# Patient Record
Sex: Female | Born: 1950
Health system: Southern US, Community
[De-identification: ages and names within clinical notes are randomized; demographics above are authoritative.]

## PROBLEM LIST (undated history)

## (undated) DIAGNOSIS — E559 Vitamin D deficiency, unspecified: Secondary | ICD-10-CM

## (undated) DIAGNOSIS — R079 Chest pain, unspecified: Secondary | ICD-10-CM

## (undated) DIAGNOSIS — E7800 Pure hypercholesterolemia, unspecified: Secondary | ICD-10-CM

## (undated) DIAGNOSIS — E039 Hypothyroidism, unspecified: Secondary | ICD-10-CM

## (undated) DIAGNOSIS — E079 Disorder of thyroid, unspecified: Secondary | ICD-10-CM

## (undated) DIAGNOSIS — C801 Malignant (primary) neoplasm, unspecified: Secondary | ICD-10-CM

## (undated) DIAGNOSIS — I10 Essential (primary) hypertension: Secondary | ICD-10-CM

## (undated) HISTORY — DX: Chest pain, unspecified: R07.9

## (undated) HISTORY — DX: Hypothyroidism, unspecified: E03.9

## (undated) HISTORY — DX: Malignant (primary) neoplasm, unspecified: C80.1

## (undated) HISTORY — DX: Essential (primary) hypertension: I10

## (undated) HISTORY — DX: Disorder of thyroid, unspecified: E07.9

## (undated) HISTORY — DX: Vitamin D deficiency, unspecified: E55.9

## (undated) HISTORY — DX: Pure hypercholesterolemia, unspecified: E78.00

---

## 1998-05-26 ENCOUNTER — Other Ambulatory Visit: Admission: RE | Admit: 1998-05-26 | Discharge: 1998-05-26 | Payer: Self-pay | Admitting: Obstetrics and Gynecology

## 2000-01-24 ENCOUNTER — Other Ambulatory Visit: Admission: RE | Admit: 2000-01-24 | Discharge: 2000-01-24 | Payer: Self-pay | Admitting: Obstetrics and Gynecology

## 2001-02-03 ENCOUNTER — Other Ambulatory Visit: Admission: RE | Admit: 2001-02-03 | Discharge: 2001-02-03 | Payer: Self-pay | Admitting: *Deleted

## 2002-12-15 ENCOUNTER — Other Ambulatory Visit: Admission: RE | Admit: 2002-12-15 | Discharge: 2002-12-15 | Payer: Self-pay | Admitting: Obstetrics and Gynecology

## 2003-12-16 ENCOUNTER — Other Ambulatory Visit: Admission: RE | Admit: 2003-12-16 | Discharge: 2003-12-16 | Payer: Self-pay | Admitting: Obstetrics and Gynecology

## 2004-04-02 ENCOUNTER — Ambulatory Visit (HOSPITAL_COMMUNITY): Admission: RE | Admit: 2004-04-02 | Discharge: 2004-04-02 | Payer: Self-pay | Admitting: Gastroenterology

## 2004-07-08 DIAGNOSIS — C801 Malignant (primary) neoplasm, unspecified: Secondary | ICD-10-CM

## 2004-07-08 HISTORY — DX: Malignant (primary) neoplasm, unspecified: C80.1

## 2004-12-26 ENCOUNTER — Other Ambulatory Visit: Admission: RE | Admit: 2004-12-26 | Discharge: 2004-12-26 | Payer: Self-pay | Admitting: Obstetrics and Gynecology

## 2005-12-27 ENCOUNTER — Other Ambulatory Visit: Admission: RE | Admit: 2005-12-27 | Discharge: 2005-12-27 | Payer: Self-pay | Admitting: Obstetrics and Gynecology

## 2012-02-28 ENCOUNTER — Ambulatory Visit (INDEPENDENT_AMBULATORY_CARE_PROVIDER_SITE_OTHER): Payer: BC Managed Care – PPO | Admitting: Obstetrics and Gynecology

## 2012-02-28 ENCOUNTER — Encounter: Payer: Self-pay | Admitting: Obstetrics and Gynecology

## 2012-02-28 VITALS — BP 110/78 | HR 70 | Ht 62.0 in | Wt 135.0 lb

## 2012-02-28 DIAGNOSIS — E079 Disorder of thyroid, unspecified: Secondary | ICD-10-CM | POA: Insufficient documentation

## 2012-02-28 DIAGNOSIS — C801 Malignant (primary) neoplasm, unspecified: Secondary | ICD-10-CM | POA: Insufficient documentation

## 2012-02-28 DIAGNOSIS — Z01419 Encounter for gynecological examination (general) (routine) without abnormal findings: Secondary | ICD-10-CM

## 2012-02-28 DIAGNOSIS — Z124 Encounter for screening for malignant neoplasm of cervix: Secondary | ICD-10-CM

## 2012-02-28 NOTE — Patient Instructions (Signed)
Patient Education Materials to be provided at check out (*indicates is located in accordion folder):  *Calcium Recommendations  

## 2012-02-28 NOTE — Progress Notes (Signed)
The patient is not taking hormone replacement therapy The patient  Is occasionally  taking a Calcium supplement. Post-menopausal bleeding:no  Last Pap: was normal July  2012 Last mammogram: was normal November  2012 Last DEXA scan : T= -1.13 May 2011 Last colonoscopy:normal September 2005  Urinary symptoms: none Normal bowel movements: Yes Reports abuse at home: No:   Subjective:    Samantha Anderson is a 61 y.o. female G2P2 who presents for annual exam.  The patient has no complaints today.   The following portions of the patient's history were reviewed and updated as appropriate: allergies, current medications, past family history, past medical history, past social history, past surgical history and problem list.  Review of Systems Pertinent items are noted in HPI. Gastrointestinal:No change in bowel habits, no abdominal pain, no rectal bleeding Genitourinary:negative for dysuria, frequency, hematuria, nocturia and urinary incontinence    Objective:     BP 110/78  Pulse 70  Ht 5\' 2"  (1.575 m)  Wt 135 lb (61.236 kg)  BMI 24.69 kg/m2  Weight:  Wt Readings from Last 1 Encounters:  02/28/12 135 lb (61.236 kg)     BMI: Body mass index is 24.69 kg/(m^2). General Appearance: Alert, appropriate appearance for age. No acute distress HEENT: Grossly normal Neck / Thyroid: Supple, no masses, nodes or enlargement Lungs: clear to auscultation bilaterally Back: No CVA tenderness Breast Exam: No masses or nodes.No dimpling, nipple retraction or discharge. Cardiovascular: Regular rate and rhythm. S1, S2, no murmur Gastrointestinal: Soft, non-tender, no masses or organomegaly Pelvic Exam: Vulva and vagina appear normal. Bimanual exam reveals normal uterus and adnexa.RV Rectovaginal: normal rectal, no masses Lymphatic Exam: Non-palpable nodes in neck, clavicular, axillary, or inguinal regions Skin: no rash or abnormalities Neurologic: Normal gait and speech, no tremor  Psychiatric:  Alert and oriented, appropriate affect.     Assessment:    Normal gyn exam    Plan:   mammogram pap smear return annually or prn Follow-up:  for annual exam Restart Vitamin D

## 2012-03-02 LAB — PAP IG W/ RFLX HPV ASCU

## 2012-05-25 ENCOUNTER — Encounter: Payer: Self-pay | Admitting: Obstetrics and Gynecology

## 2012-05-25 NOTE — Progress Notes (Signed)
Quick Note:  Please send "Dense breast" letter to patient and document in chart when letter is sent. ______ 

## 2014-05-09 ENCOUNTER — Encounter: Payer: Self-pay | Admitting: Obstetrics and Gynecology

## 2019-08-09 DIAGNOSIS — E559 Vitamin D deficiency, unspecified: Secondary | ICD-10-CM | POA: Diagnosis not present

## 2019-08-09 DIAGNOSIS — Z0000 Encounter for general adult medical examination without abnormal findings: Secondary | ICD-10-CM | POA: Diagnosis not present

## 2019-08-09 DIAGNOSIS — E039 Hypothyroidism, unspecified: Secondary | ICD-10-CM | POA: Diagnosis not present

## 2019-08-09 DIAGNOSIS — Z6827 Body mass index (BMI) 27.0-27.9, adult: Secondary | ICD-10-CM | POA: Diagnosis not present

## 2019-08-09 DIAGNOSIS — I10 Essential (primary) hypertension: Secondary | ICD-10-CM | POA: Diagnosis not present

## 2019-08-09 DIAGNOSIS — Z9181 History of falling: Secondary | ICD-10-CM | POA: Diagnosis not present

## 2019-08-09 DIAGNOSIS — R079 Chest pain, unspecified: Secondary | ICD-10-CM | POA: Diagnosis not present

## 2019-08-09 DIAGNOSIS — Z79899 Other long term (current) drug therapy: Secondary | ICD-10-CM | POA: Diagnosis not present

## 2019-08-09 DIAGNOSIS — E7800 Pure hypercholesterolemia, unspecified: Secondary | ICD-10-CM | POA: Diagnosis not present

## 2019-08-18 ENCOUNTER — Encounter: Payer: Self-pay | Admitting: Cardiology

## 2019-08-18 ENCOUNTER — Other Ambulatory Visit: Payer: Self-pay

## 2019-08-18 ENCOUNTER — Ambulatory Visit (INDEPENDENT_AMBULATORY_CARE_PROVIDER_SITE_OTHER): Payer: Medicare PPO | Admitting: Cardiology

## 2019-08-18 VITALS — BP 150/100 | HR 69 | Ht 61.0 in | Wt 149.0 lb

## 2019-08-18 DIAGNOSIS — R072 Precordial pain: Secondary | ICD-10-CM

## 2019-08-18 DIAGNOSIS — Z01812 Encounter for preprocedural laboratory examination: Secondary | ICD-10-CM

## 2019-08-18 DIAGNOSIS — E782 Mixed hyperlipidemia: Secondary | ICD-10-CM | POA: Diagnosis not present

## 2019-08-18 DIAGNOSIS — I10 Essential (primary) hypertension: Secondary | ICD-10-CM | POA: Diagnosis not present

## 2019-08-18 MED ORDER — TELMISARTAN 40 MG PO TABS: 40.0000 mg | ORAL_TABLET | Freq: Every day | ORAL | 1 refills | Status: DC

## 2019-08-18 NOTE — Patient Instructions (Signed)
Medication Instructions:  Your physician has recommended you make the following change in your medication:   START: Telmisartan 40 mg Take 1 tab daily  *If you need a refill on your cardiac medications before your next appointment, please call your pharmacy*  Lab Work: Your physician recommends that you return for lab work in:   3-7 days prior to CT:BMP   If you have labs (blood work) drawn today and your tests are completely normal, you will receive your results only by: Marland Kitchen MyChart Message (if you have MyChart) OR . A paper copy in the mail If you have any lab test that is abnormal or we need to change your treatment, we will call you to review the results.  Testing/Procedures: Your physician has requested that you have an echocardiogram. Echocardiography is a painless test that uses sound waves to create images of your heart. It provides your doctor with information about the size and shape of your heart and how well your heart's chambers and valves are working. This procedure takes approximately one hour. There are no restrictions for this procedure.   Your physician has requested that you have cardiac CT. Cardiac computed tomography (CT) is a painless test that uses an x-ray machine to take clear, detailed pictures of your heart. For further information please visit [facility]ugeFiesta[employer]tn. Please follow instruction sheet as given.  Your cardiac CT will be scheduled at one of the below locations:   [facility] [address] [place], [place] [address] [phone]   If scheduled at [facility], please arrive at the [facility] main entrance of [facility] 30 minutes prior to test start time. Proceed to the [facility] Radiology Department (first floor) to check-in and test prep.    Please follow these instructions carefully (unless otherwise directed):    On the Night Before the Test: . Be sure to Drink plenty of water. . Do not consume any  caffeinated/decaffeinated beverages or chocolate 12 hours prior to your test. . Do not take any antihistamines 12 hours prior to your test. . If you take Metformin do not take 24 hours prior to test. . If the patient has contrast allergy: ? Patient will need a prescription for Prednisone and very clear instructions (as follows): 1. Prednisone 50 mg - take 13 hours prior to test 2. Take another Prednisone 50 mg 7 hours prior to test 3. Take another Prednisone 50 mg 1 hour prior to test 4. Take Benadryl 50 mg 1 hour prior to test . Patient must complete all four doses of above prophylactic medications. . Patient will need a ride after test due to Benadryl.  On the Day of the Test: . Drink plenty of water. Do not drink any water within one hour of the test. . Do not eat any food 4 hours prior to the test. . You may take your regular medications prior to the test.  . Take metoprolol (TOPROL XL) two hours prior to test. . FEMALES- please wear underwire-free bra if available                 -If HR is less than 55 BPM- No Beta Blocker                -IF HR is greater than 55 BPM and patient is less than or equal to 32 yrs old  Take TOPROL XL 25 mg            After the Test: .  Drink plenty of water. . After receiving IV contrast, you may experience a mild flushed feeling. This is normal. . On occasion, you may experience a mild rash up to 24 hours after the test. This is not dangerous. If this occurs, you can take Benadryl 25 mg and increase your fluid intake. . If you experience trouble breathing, this can be serious. If it is severe call 911 IMMEDIATELY. If it is mild, please call our office..   Once we have confirmed authorization from your insurance company, we will call you to set up a date and time for your test.   For non-scheduling related questions, please contact the cardiac imaging nurse navigator should you have any questions/concerns: Marchia Bond, RN Navigator Cardiac  Imaging [facility] [phone] mobile    Follow-Up: At [facility], you and your health needs are our priority.  As part of our continuing mission to provide you with exceptional heart care, we have created designated Provider Care Teams.  These Care Teams include your primary Cardiologist (physician) and Advanced Practice Providers (APPs -  Physician Assistants and Nurse Practitioners) who all work together to provide you with the care you need, when you need it.  Your next appointment:   2 week(s)  The format for your next appointment:   In Person  Provider:   Berniece Salines, DO  Other Instructions

## 2019-08-18 NOTE — Progress Notes (Signed)
Cardiology Office Note:    Date:  08/18/2019   ID:  JULIE NAY, DOB 09/17/1950, MRN [id number]  PCP:  Myer Peer, MD  Cardiologist:  No primary care provider on file.  Electrophysiologist:  None   Referring MD: Mateo Flow, MD   Chief Complaint  Patient presents with  . Chest Pain    History of Present Illness:    AHSLEY Anderson is a 69 y.o. female with a hx of hypertension, hyperlipidemia, family history of coronary artery disease and hyperthyroidism presents to be evaluated for chest pain. The patient tells me that she has been experiencing intermittent chest pressure which is diffuse of the midsternum area and most times radiates to her neck. She tells she experienced her first episode about a year ago.   I independently review the notes from the patient's pcp office. She has had some difficulties with blood pressure control. She has a recent increase to her medication regimen where she was placed on telmisartan-HCTZ 80-12.94m with addition of toprol xl 25 mg daily. After the started this regimen she experienced episodes of hypotension with her systolic bp in the 993J As a result of this her telmisartan-HCTZ was stopped. She has only been taking her toprol xl.   She offers no other complaints at this time.  Past Medical History:  Diagnosis Date  . Benign essential hypertension   . Cancer ([place] 2006   melanoma  . Chest pain   . Elevated LDL cholesterol level   . Hypothyroidism   . Thyroid disease    hyperthyroidism  . Vitamin D deficiency     Past Surgical History:  Procedure Laterality Date  . CESAREAN SECTION      Current Medications: Current Meds  Medication Sig  . Ascorbic Acid (VITAMIN C) 500 MG CAPS Take 500 mg by mouth daily.  .Marland Kitchenaspirin EC 81 MG tablet Take 81 mg by mouth daily.  .Marland Kitchenatorvastatin (LIPITOR) 20 MG tablet 20 mg daily.  . Cholecalciferol 1.25 MG (50000 UT) capsule cholecalciferol (vitamin D3) 1,250 mcg (50,000 unit) capsule  Take one  capsule by mouth once per week for 8 weeks.  .Marland KitchenMAGNESIUM GLYCINATE PO Take 120 mg by mouth daily.  . metoprolol succinate (TOPROL-XL) 25 MG 24 hr tablet 25 mg daily.  .Marland Kitchenomeprazole (PRILOSEC) 20 MG capsule 20 mg daily.  .Marland KitchenSYNTHROID 100 MCG tablet 100 mcg daily.  . vitamin k 100 MCG tablet Take 100 mcg by mouth daily.  . Zinc Sulfate (ZINC 15 PO) Take 15 mg by mouth daily.     Allergies:   Patient has no known allergies.   Social History   Socioeconomic History  . Marital status: Married    Spouse name: Not on file  . Number of children: Not on file  . Years of education: Not on file  . Highest education level: Not on file  Occupational History  . Not on file  Tobacco Use  . Smoking status: Never Smoker  . Smokeless tobacco: Never Used  Substance and Sexual Activity  . Alcohol use: Yes    Comment: occassionally  . Drug use: No  . Sexual activity: Yes    Birth control/protection: Post-menopausal  Other Topics Concern  . Not on file  Social History Narrative  . Not on file   Social Determinants of Health   Financial Resource Strain:   . Difficulty of Paying Living Expenses: Not on file  Food Insecurity:   . Worried About [employer]  Food in the Last Year: Not on file  . Ran Out of Food in the Last Year: Not on file  Transportation Needs:   . Lack of Transportation (Medical): Not on file  . Lack of Transportation (Non-Medical): Not on file  Physical Activity:   . Days of Exercise per Week: Not on file  . Minutes of Exercise per Session: Not on file  Stress:   . Feeling of Stress : Not on file  Social Connections:   . Frequency of Communication with Friends and Family: Not on file  . Frequency of Social Gatherings with Friends and Family: Not on file  . Attends Religious Services: Not on file  . Active Member of Clubs or Organizations: Not on file  . Attends Archivist Meetings: Not on file  . Marital Status: Not on file     Family History: The  patient's family history includes Aneurysm in her father; Cancer in her father, maternal uncle, and paternal uncle; Hypercholesterolemia in her brother and sister; Hypertension in her brother, father, and sister; Osteoporosis in her mother.  ROS:   Review of Systems  Constitution: Negative for decreased appetite, fever and weight gain.  HENT: Negative for congestion, ear discharge, hoarse voice and sore throat.   Eyes: Negative for discharge, redness, vision loss in right eye and visual halos.  Cardiovascular: Negative for chest pain, dyspnea on exertion, leg swelling, orthopnea and palpitations.  Respiratory: Negative for cough, hemoptysis, shortness of breath and snoring.   Endocrine: Negative for heat intolerance and polyphagia.  Hematologic/Lymphatic: Negative for bleeding problem. Does not bruise/bleed easily.  Skin: Negative for flushing, nail changes, rash and suspicious lesions.  Musculoskeletal: Negative for arthritis, joint pain, muscle cramps, myalgias, neck pain and stiffness.  Gastrointestinal: Negative for abdominal pain, bowel incontinence, diarrhea and excessive appetite.  Genitourinary: Negative for decreased libido, genital sores and incomplete emptying.  Neurological: Negative for brief paralysis, focal weakness, headaches and loss of balance.  Psychiatric/Behavioral: Negative for altered mental status, depression and suicidal ideas.  Allergic/Immunologic: Negative for HIV exposure and persistent infections.    EKGs/Labs/Other Studies Reviewed:    The following studies were reviewed today:   EKG:  The ekg ordered today demonstrates sinus rhythm, HR 73 bpm. No other ekg for comparison.  Recent Labs: I was able to independently reviewed the patient's blood work from his PCP which was on 2021  CBC 6.6, hemoglobin 14.6, hematocrit 43, platelet 327 Chemistry: Glucose 92, BUN 14, creatinine 0.7, total bili 0.5, AST 16, ALT 15, alk phos 81, calcium 9.9, sodium 143, potassium  4.2, chloride 106, bicarb 28, total protein 7.1, Albumin 4.5, globulin 2.6,  Recent Lipid Panel Lipid profile done on by his PCP office I was able to independently review triglyceride 81, total cholesterol 266, HDL 67, LDL 183  Physical Exam:    VS:  BP (!) 150/100 (BP Location: Left Arm, Patient Position: Sitting, Cuff Size: Normal)   Pulse 69   Ht 5' 1"  (1.549 m)   Wt 149 lb (67.6 kg)   SpO2 99%   BMI 28.15 kg/m     Wt Readings from Last 3 Encounters:  08/18/19 149 lb (67.6 kg)  08/09/19 148 lb (67.1 kg)  02/28/12 135 lb (61.2 kg)     GEN: Well nourished, well developed in no acute distress HEENT: Normal NECK: No JVD; No carotid bruits LYMPHATICS: No lymphadenopathy CARDIAC: S1S2 noted,RRR, no murmurs, rubs, gallops RESPIRATORY:  Clear to auscultation without rales, wheezing or rhonchi  ABDOMEN: Soft,  non-tender, non-distended, +bowel sounds, no guarding. EXTREMITIES: No edema, No cyanosis, no clubbing MUSCULOSKELETAL:  No deformity  SKIN: Warm and dry NEUROLOGIC:  Alert and oriented x 3, non-focal PSYCHIATRIC:  Normal affect, good insight  ASSESSMENT:    1. Pre-procedure lab exam   2. Precordial pain   3. Essential hypertension   4. Mixed hyperlipidemia    PLAN:    Her chest pain is concerning- given her risk factors. At this time I will like to do an ischemic evaluation on this patient. She does not have any IV contrast dye allergy. I have discussed a coronary CTA with the patient and she is agreeable to proceed with this testing.  Due to her longstanding hypertension, I will like to get a 2D echocardiogram to assess LV/RV function and wall thickness.   She is hypertensive in the office today: BP manually taken by me right: 1580/110 mmgHg  And left 150/100 mmHg. I did discuss with the patient that she will need an addition agent as her blood pressure target is less than 130/80 mmhg. I will start her on telrmisartan 40 mg daily. She will also take her blood pressure  daily and she will notify my office if her blood pressure persistently closer to 191 mmhg systolic or if she develops dizziness. Otherwise she will follow up in two weeks for blood pressure check.  Her most recent lipid panel shows evidence of LDL 183. She has been started on moderate intensity statin with lipitor 32m daily/ She will continue this for now. She will get repeat lab work with her pcp.   The patient is in agreement with the above plan. The patient left the office in stable condition.  The patient will follow up in   Medication Adjustments/Labs and Tests Ordered: Current medicines are reviewed at length with the patient today.  Concerns regarding medicines are outlined above.  Orders Placed This Encounter  Procedures  . CT CORONARY FRACTIONAL FLOW RESERVE DATA PREP  . CT CORONARY FRACTIONAL FLOW RESERVE FLUID ANALYSIS  . CT CORONARY MORPH W/CTA COR W/SCORE W/CA W/CM &/OR WO/CM  . Basic Metabolic Panel (BMET)  . EKG 12-Lead  . ECHOCARDIOGRAM COMPLETE   Meds ordered this encounter  Medications  . telmisartan (MICARDIS) 40 MG tablet    Sig: Take 1 tablet (40 mg total) by mouth daily.    Dispense:  90 tablet    Refill:  1    Patient Instructions  Medication Instructions:  Your physician has recommended you make the following change in your medication:   START: Telmisartan 40 mg Take 1 tab daily  *If you need a refill on your cardiac medications before your next appointment, please call your pharmacy*  Lab Work: Your physician recommends that you return for lab work in:   3-7 days prior to CT:BMP   If you have labs (blood work) drawn today and your tests are completely normal, you will receive your results only by: .Marland KitchenMyChart Message (if you have MyChart) OR . A paper copy in the mail If you have any lab test that is abnormal or we need to change your treatment, we will call you to review the results.  Testing/Procedures: Your physician has requested that you  have an echocardiogram. Echocardiography is a painless test that uses sound waves to create images of your heart. It provides your doctor with information about the size and shape of your heart and how well your heart's chambers and valves are working. This procedure takes approximately  one hour. There are no restrictions for this procedure.   Your physician has requested that you have cardiac CT. Cardiac computed tomography (CT) is a painless test that uses an x-ray machine to take clear, detailed pictures of your heart. For further information please visit HugeFiesta.tn. Please follow instruction sheet as given.  Your cardiac CT will be scheduled at one of the below locations:   [facility] [address] [place], [place] [address] [phone]   If scheduled at [facility], please arrive at the [facility] main entrance of [facility] 30 minutes prior to test start time. Proceed to the [facility] Radiology Department (first floor) to check-in and test prep.    Please follow these instructions carefully (unless otherwise directed):    On the Night Before the Test: . Be sure to Drink plenty of water. . Do not consume any caffeinated/decaffeinated beverages or chocolate 12 hours prior to your test. . Do not take any antihistamines 12 hours prior to your test. . If you take Metformin do not take 24 hours prior to test. . If the patient has contrast allergy: ? Patient will need a prescription for Prednisone and very clear instructions (as follows): 1. Prednisone 50 mg - take 13 hours prior to test 2. Take another Prednisone 50 mg 7 hours prior to test 3. Take another Prednisone 50 mg 1 hour prior to test 4. Take Benadryl 50 mg 1 hour prior to test . Patient must complete all four doses of above prophylactic medications. . Patient will need a ride after test due to Benadryl.  On the Day of the Test: . Drink plenty of water. Do not drink any water  within one hour of the test. . Do not eat any food 4 hours prior to the test. . You may take your regular medications prior to the test.  . Take metoprolol (TOPROL XL) two hours prior to test. . FEMALES- please wear underwire-free bra if available                 -If HR is less than 55 BPM- No Beta Blocker                -IF HR is greater than 55 BPM and patient is less than or equal to 63 yrs old  Take TOPROL XL 25 mg            After the Test: . Drink plenty of water. . After receiving IV contrast, you may experience a mild flushed feeling. This is normal. . On occasion, you may experience a mild rash up to 24 hours after the test. This is not dangerous. If this occurs, you can take Benadryl 25 mg and increase your fluid intake. . If you experience trouble breathing, this can be serious. If it is severe call 911 IMMEDIATELY. If it is mild, please call our office..   Once we have confirmed authorization from your insurance company, we will call you to set up a date and time for your test.   For non-scheduling related questions, please contact the cardiac imaging nurse navigator should you have any questions/concerns: Marchia Bond, RN Navigator Cardiac Imaging [facility] [phone] mobile    Follow-Up: At [facility], you and your health needs are our priority.  As part of our continuing mission to provide you with exceptional heart care, we have created designated Provider Care Teams.  These Care Teams include your primary Cardiologist (  physician) and Advanced Practice Providers (APPs -  Physician Assistants and Nurse Practitioners) who all work together to provide you with the care you need, when you need it.  Your next appointment:   2 week(s)  The format for your next appointment:   In Person  Provider:   Berniece Salines, DO  Other Instructions      Adopting a Healthy Lifestyle.  Know what a healthy weight is for you (roughly BMI <25) and  aim to maintain this   Aim for 7+ servings of fruits and vegetables daily   65-80+ fluid ounces of water or unsweet tea for healthy kidneys   Limit to max 1 drink of alcohol per day; avoid smoking/tobacco   Limit animal fats in diet for cholesterol and heart health - choose grass fed whenever available   Avoid highly processed foods, and foods high in saturated/trans fats   Aim for low stress - take time to unwind and care for your mental health   Aim for 150 min of moderate intensity exercise weekly for heart health, and weights twice weekly for bone health   Aim for 7-9 hours of sleep daily   When it comes to diets, agreement about the perfect plan isnt easy to find, even among the experts. Experts at the [employer] developed an idea known as the Healthy Eating Plate. Just imagine a plate divided into logical, healthy portions.   The emphasis is on diet quality:   Load up on vegetables and fruits - one-half of your plate: Aim for color and variety, and remember that potatoes dont count.   Go for whole grains - one-quarter of your plate: Whole wheat, barley, wheat berries, quinoa, oats, brown rice, and foods made with them. If you want pasta, go with whole wheat pasta.   Protein power - one-quarter of your plate: Fish, chicken, beans, and nuts are all healthy, versatile protein sources. Limit red meat.   The diet, however, does go beyond the plate, offering a few other suggestions.   Use healthy plant oils, such as olive, canola, soy, corn, sunflower and peanut. Check the labels, and avoid partially hydrogenated oil, which have unhealthy trans fats.   If youre thirsty, drink water. Coffee and tea are good in moderation, but skip sugary drinks and limit milk and dairy products to one or two daily servings.   The type of carbohydrate in the diet is more important than the amount. Some sources of carbohydrates, such as vegetables, fruits, whole grains, and  beans-are healthier than others.   Finally, stay active  Signed, Berniece Salines, DO  08/18/2019 8:18 PM    [facility]

## 2019-08-30 ENCOUNTER — Other Ambulatory Visit: Payer: Self-pay

## 2019-08-30 ENCOUNTER — Ambulatory Visit (INDEPENDENT_AMBULATORY_CARE_PROVIDER_SITE_OTHER): Payer: Medicare PPO | Admitting: Cardiology

## 2019-08-30 ENCOUNTER — Encounter: Payer: Self-pay | Admitting: Cardiology

## 2019-08-30 VITALS — BP 144/98 | HR 68 | Ht 61.0 in | Wt 148.0 lb

## 2019-08-30 DIAGNOSIS — R079 Chest pain, unspecified: Secondary | ICD-10-CM | POA: Diagnosis not present

## 2019-08-30 DIAGNOSIS — I10 Essential (primary) hypertension: Secondary | ICD-10-CM

## 2019-08-30 DIAGNOSIS — E782 Mixed hyperlipidemia: Secondary | ICD-10-CM

## 2019-08-30 NOTE — Patient Instructions (Signed)
Medication Instructions:  Your physician recommends that you continue on your current medications as directed. Please refer to the Current Medication list given to you today.  *If you need a refill on your cardiac medications before your next appointment, please call your pharmacy*  Lab Work: None If you have labs (blood work) drawn today and your tests are completely normal, you will receive your results only by: Marland Kitchen MyChart Message (if you have MyChart) OR . A paper copy in the mail If you have any lab test that is abnormal or we need to change your treatment, we will call you to review the results.  Testing/Procedures: None  Follow-Up: At [facility], you and your health needs are our priority.  As part of our continuing mission to provide you with exceptional heart care, we have created designated Provider Care Teams.  These Care Teams include your primary Cardiologist (physician) and Advanced Practice Providers (APPs -  Physician Assistants and Nurse Practitioners) who all work together to provide you with the care you need, when you need it.  Your next appointment:   3 month(s)  The format for your next appointment:   In Person  Provider:   Berniece Salines, DO  Other Instructions

## 2019-08-30 NOTE — Progress Notes (Signed)
Cardiology Office Note:    Date:  08/30/2019   ID:  Samantha Anderson, DOB 02/27/1951, MRN [id number]  PCP:  Mateo Flow, MD  Cardiologist:  Berniece Salines, DO  Electrophysiologist:  None   Referring MD: Myer Peer, MD   Follow-up visit  History of Present Illness:    Samantha Anderson is a 69 y.o. female with a hx of hypertension, hyperlipidemia, hypothyroidism presented initially on August 18, 2019 to be evaluated for chest pain.  At her visit the patient blood pressure was elevated at that time she was only on metoprolol succinate 25 mg daily.  She has been on telmisartan-hydrochlorothiazide which the patient reported that made her hypotensive.  Given her elevated blood pressure I restarted the patient on telmisartan 40 mg daily.  Therefore her regimen was now metoprolol succinate 25 mg daily and telmisartan 40 mg daily.   Today she is here for a follow up visit and offers no complaints.  She did bring her blood pressure readings from home with home which seems to be averaging between 125 to [id number] mmHg systolic.  Chest pain still intermittent.  Past Medical History:  Diagnosis Date  . Benign essential hypertension   . Cancer ([place]) 2006   melanoma  . Chest pain   . Elevated LDL cholesterol level   . Hypothyroidism   . Thyroid disease    hyperthyroidism  . Vitamin D deficiency     Past Surgical History:  Procedure Laterality Date  . CESAREAN SECTION      Current Medications: Current Meds  Medication Sig  . Ascorbic Acid (VITAMIN C) 500 MG CAPS Take 500 mg by mouth daily.  Marland Kitchen aspirin EC 81 MG tablet Take 81 mg by mouth daily.  Marland Kitchen atorvastatin (LIPITOR) 20 MG tablet 20 mg daily.  . Cholecalciferol (VITAMIN D3) 125 MCG (5000 UT) CAPS Take 10,000 Units by mouth daily.  Marland Kitchen MAGNESIUM GLYCINATE PO Take 120 mg by mouth daily.  . metoprolol succinate (TOPROL-XL) 25 MG 24 hr tablet Take 25 mg by mouth daily.   Marland Kitchen SYNTHROID 100 MCG tablet 100 mcg daily.  Marland Kitchen telmisartan (MICARDIS) 40  MG tablet Take 1 tablet (40 mg total) by mouth daily.  . vitamin k 100 MCG tablet Take 100 mcg by mouth daily.  . Zinc Sulfate (ZINC 15 PO) Take 15 mg by mouth daily.     Allergies:   Patient has no known allergies.   Social History   Socioeconomic History  . Marital status: Married    Spouse name: Not on file  . Number of children: Not on file  . Years of education: Not on file  . Highest education level: Not on file  Occupational History  . Not on file  Tobacco Use  . Smoking status: Never Smoker  . Smokeless tobacco: Never Used  Substance and Sexual Activity  . Alcohol use: Yes    Comment: occassionally  . Drug use: No  . Sexual activity: Yes    Birth control/protection: Post-menopausal  Other Topics Concern  . Not on file  Social History Narrative  . Not on file   Social Determinants of Health   Financial Resource Strain:   . Difficulty of Paying Living Expenses: Not on file  Food Insecurity:   . Worried About Charity fundraiser in the Last Year: Not on file  . Ran Out of Food in the Last Year: Not on file  Transportation Needs:   . Lack of Transportation (Medical): Not on  file  . Lack of Transportation (Non-Medical): Not on file  Physical Activity:   . Days of Exercise per Week: Not on file  . Minutes of Exercise per Session: Not on file  Stress:   . Feeling of Stress : Not on file  Social Connections:   . Frequency of Communication with Friends and Family: Not on file  . Frequency of Social Gatherings with Friends and Family: Not on file  . Attends Religious Services: Not on file  . Active Member of Clubs or Organizations: Not on file  . Attends Archivist Meetings: Not on file  . Marital Status: Not on file     Family History: The patient's family history includes Aneurysm in her father; Cancer in her father, maternal uncle, and paternal uncle; Hypercholesterolemia in her brother and sister; Hypertension in her brother, father, and sister;  Osteoporosis in her mother.  ROS:   Review of Systems  Constitution: Negative for decreased appetite, fever and weight gain.  HENT: Negative for congestion, ear discharge, hoarse voice and sore throat.   Eyes: Negative for discharge, redness, vision loss in right eye and visual halos.  Cardiovascular: Negative for chest pain, dyspnea on exertion, leg swelling, orthopnea and palpitations.  Respiratory: Negative for cough, hemoptysis, shortness of breath and snoring.   Endocrine: Negative for heat intolerance and polyphagia.  Hematologic/Lymphatic: Negative for bleeding problem. Does not bruise/bleed easily.  Skin: Negative for flushing, nail changes, rash and suspicious lesions.  Musculoskeletal: Negative for arthritis, joint pain, muscle cramps, myalgias, neck pain and stiffness.  Gastrointestinal: Negative for abdominal pain, bowel incontinence, diarrhea and excessive appetite.  Genitourinary: Negative for decreased libido, genital sores and incomplete emptying.  Neurological: Negative for brief paralysis, focal weakness, headaches and loss of balance.  Psychiatric/Behavioral: Negative for altered mental status, depression and suicidal ideas.  Allergic/Immunologic: Negative for HIV exposure and persistent infections.    EKGs/Labs/Other Studies Reviewed:    The following studies were reviewed today:   EKG: None today  Recent Labs: No results found for requested labs within last 8760 hours.  Recent Lipid Panel No results found for: CHOL, TRIG, HDL, CHOLHDL, VLDL, LDLCALC, LDLDIRECT  Physical Exam:    VS:  BP (!) 144/98   Pulse 68   Ht 5\' 1"  (1.549 m)   Wt 148 lb (67.1 kg)   BMI 27.96 kg/m     Wt Readings from Last 3 Encounters:  08/30/19 148 lb (67.1 kg)  08/18/19 149 lb (67.6 kg)  08/09/19 148 lb (67.1 kg)     GEN: Well nourished, well developed in no acute distress HEENT: Normal NECK: No JVD; No carotid bruits LYMPHATICS: No lymphadenopathy CARDIAC: S1S2 noted,RRR,  no murmurs, rubs, gallops RESPIRATORY:  Clear to auscultation without rales, wheezing or rhonchi  ABDOMEN: Soft, non-tender, non-distended, +bowel sounds, no guarding. EXTREMITIES: No edema, No cyanosis, no clubbing MUSCULOSKELETAL:  No deformity  SKIN: Warm and dry NEUROLOGIC:  Alert and oriented x 3, non-focal PSYCHIATRIC:  Normal affect, good insight  ASSESSMENT:    1. Essential hypertension   2. Chest pain of uncertain etiology   3. Mixed hyperlipidemia    PLAN:     1.  Her blood pressure seems to be improving.  The patient is asymptomatic at lower  systolic blood pressure therefore I will keep her on this regimen for now.  2.  Her echocardiogram and coronary CTA still pending.  Once these testing have been done further recommendations needed will be made.  3.  Continue patient  on atorvastatin 20 mg daily for hyperlipidemia.   The patient is in agreement with the above plan. The patient left the office in stable condition.  The patient will follow up in 3 months or sooner if needed   Medication Adjustments/Labs and Tests Ordered: Current medicines are reviewed at length with the patient today.  Concerns regarding medicines are outlined above.  No orders of the defined types were placed in this encounter.  No orders of the defined types were placed in this encounter.   Patient Instructions  Medication Instructions:  Your physician recommends that you continue on your current medications as directed. Please refer to the Current Medication list given to you today.  *If you need a refill on your cardiac medications before your next appointment, please call your pharmacy*  Lab Work: None If you have labs (blood work) drawn today and your tests are completely normal, you will receive your results only by: Marland Kitchen MyChart Message (if you have MyChart) OR . A paper copy in the mail If you have any lab test that is abnormal or we need to change your treatment, we will call you to  review the results.  Testing/Procedures: None  Follow-Up: At [facility], you and your health needs are our priority.  As part of our continuing mission to provide you with exceptional heart care, we have created designated Provider Care Teams.  These Care Teams include your primary Cardiologist (physician) and Advanced Practice Providers (APPs -  Physician Assistants and Nurse Practitioners) who all work together to provide you with the care you need, when you need it.  Your next appointment:   3 month(s)  The format for your next appointment:   In Person  Provider:   Berniece Salines, DO  Other Instructions      Adopting a Healthy Lifestyle.  Know what a healthy weight is for you (roughly BMI <25) and aim to maintain this   Aim for 7+ servings of fruits and vegetables daily   65-80+ fluid ounces of water or unsweet tea for healthy kidneys   Limit to max 1 drink of alcohol per day; avoid smoking/tobacco   Limit animal fats in diet for cholesterol and heart health - choose grass fed whenever available   Avoid highly processed foods, and foods high in saturated/trans fats   Aim for low stress - take time to unwind and care for your mental health   Aim for 150 min of moderate intensity exercise weekly for heart health, and weights twice weekly for bone health   Aim for 7-9 hours of sleep daily   When it comes to diets, agreement about the perfect plan isnt easy to find, even among the experts. Experts at the [employer] developed an idea known as the Healthy Eating Plate. Just imagine a plate divided into logical, healthy portions.   The emphasis is on diet quality:   Load up on vegetables and fruits - one-half of your plate: Aim for color and variety, and remember that potatoes dont count.   Go for whole grains - one-quarter of your plate: Whole wheat, barley, wheat berries, quinoa, oats, brown rice, and foods made with them. If you want pasta, go  with whole wheat pasta.   Protein power - one-quarter of your plate: Fish, chicken, beans, and nuts are all healthy, versatile protein sources. Limit red meat.   The diet, however, does go beyond the plate, offering a few other suggestions.   Use healthy plant  oils, such as olive, canola, soy, corn, sunflower and peanut. Check the labels, and avoid partially hydrogenated oil, which have unhealthy trans fats.   If youre thirsty, drink water. Coffee and tea are good in moderation, but skip sugary drinks and limit milk and dairy products to one or two daily servings.   The type of carbohydrate in the diet is more important than the amount. Some sources of carbohydrates, such as vegetables, fruits, whole grains, and beans-are healthier than others.   Finally, stay active  Signed, Berniece Salines, DO  08/30/2019 9:53 AM    [place]

## 2019-09-02 DIAGNOSIS — Z1211 Encounter for screening for malignant neoplasm of colon: Secondary | ICD-10-CM | POA: Diagnosis not present

## 2019-09-02 DIAGNOSIS — Z1239 Encounter for other screening for malignant neoplasm of breast: Secondary | ICD-10-CM | POA: Diagnosis not present

## 2019-09-02 DIAGNOSIS — M810 Age-related osteoporosis without current pathological fracture: Secondary | ICD-10-CM | POA: Diagnosis not present

## 2019-09-02 DIAGNOSIS — Z124 Encounter for screening for malignant neoplasm of cervix: Secondary | ICD-10-CM | POA: Diagnosis not present

## 2019-09-02 DIAGNOSIS — Z6826 Body mass index (BMI) 26.0-26.9, adult: Secondary | ICD-10-CM | POA: Diagnosis not present

## 2019-09-02 DIAGNOSIS — Z1231 Encounter for screening mammogram for malignant neoplasm of breast: Secondary | ICD-10-CM | POA: Diagnosis not present

## 2019-09-02 DIAGNOSIS — Z01419 Encounter for gynecological examination (general) (routine) without abnormal findings: Secondary | ICD-10-CM | POA: Diagnosis not present

## 2019-09-02 DIAGNOSIS — Z8481 Family history of carrier of genetic disease: Secondary | ICD-10-CM | POA: Diagnosis not present

## 2019-09-02 DIAGNOSIS — E559 Vitamin D deficiency, unspecified: Secondary | ICD-10-CM | POA: Diagnosis not present

## 2019-09-28 DIAGNOSIS — Z01812 Encounter for preprocedural laboratory examination: Secondary | ICD-10-CM | POA: Diagnosis not present

## 2019-09-28 DIAGNOSIS — R072 Precordial pain: Secondary | ICD-10-CM | POA: Diagnosis not present

## 2019-09-28 LAB — BASIC METABOLIC PANEL
BUN/Creatinine Ratio: 21 (ref 12–28)
BUN: 17 mg/dL (ref 8–27)
CO2: 23 mmol/L (ref 20–29)
Calcium: 9.8 mg/dL (ref 8.7–10.3)
Chloride: 102 mmol/L (ref 96–106)
Creatinine, Ser: 0.8 mg/dL (ref 0.57–1.00)
GFR calc Af Amer: 89 mL/min/{1.73_m2} (ref >59)
GFR calc non Af Amer: 77 mL/min/{1.73_m2} (ref >59)
Glucose: 87 mg/dL (ref 65–99)
Potassium: 4.6 mmol/L (ref 3.5–5.2)
Sodium: 140 mmol/L (ref 134–144)

## 2019-09-29 ENCOUNTER — Telehealth (HOSPITAL_COMMUNITY): Payer: Self-pay | Admitting: Emergency Medicine

## 2019-09-29 ENCOUNTER — Encounter (HOSPITAL_COMMUNITY): Payer: Self-pay

## 2019-09-29 DIAGNOSIS — Z139 Encounter for screening, unspecified: Secondary | ICD-10-CM | POA: Diagnosis not present

## 2019-09-29 DIAGNOSIS — D039 Melanoma in situ, unspecified: Secondary | ICD-10-CM | POA: Insufficient documentation

## 2019-09-29 DIAGNOSIS — Z8052 Family history of malignant neoplasm of bladder: Secondary | ICD-10-CM | POA: Diagnosis not present

## 2019-09-29 DIAGNOSIS — Z803 Family history of malignant neoplasm of breast: Secondary | ICD-10-CM | POA: Diagnosis not present

## 2019-09-29 DIAGNOSIS — Z8481 Family history of carrier of genetic disease: Secondary | ICD-10-CM | POA: Diagnosis not present

## 2019-09-29 DIAGNOSIS — Z800 Family history of malignant neoplasm of digestive organs: Secondary | ICD-10-CM | POA: Diagnosis not present

## 2019-09-29 DIAGNOSIS — M810 Age-related osteoporosis without current pathological fracture: Secondary | ICD-10-CM | POA: Diagnosis not present

## 2019-09-29 DIAGNOSIS — M8580 Other specified disorders of bone density and structure, unspecified site: Secondary | ICD-10-CM | POA: Insufficient documentation

## 2019-09-29 DIAGNOSIS — E7800 Pure hypercholesterolemia, unspecified: Secondary | ICD-10-CM | POA: Insufficient documentation

## 2019-09-29 DIAGNOSIS — Z801 Family history of malignant neoplasm of trachea, bronchus and lung: Secondary | ICD-10-CM | POA: Diagnosis not present

## 2019-09-29 DIAGNOSIS — E559 Vitamin D deficiency, unspecified: Secondary | ICD-10-CM | POA: Insufficient documentation

## 2019-09-29 NOTE — Telephone Encounter (Signed)
Pt calling to clarify instructions from mychart message sent by me earlier today. She takes her daily TOPROL XL nightly. So she was instructed to take per usual, adding dose 2 hr prior to scan for HR control. Pt verbalized understanding.  Marchia Bond RN Navigator Cardiac Imaging [facility] [phone] Office  [phone] Cell

## 2019-09-30 ENCOUNTER — Other Ambulatory Visit: Payer: Self-pay

## 2019-09-30 ENCOUNTER — Ambulatory Visit (HOSPITAL_COMMUNITY)
Admission: RE | Admit: 2019-09-30 | Discharge: 2019-09-30 | Disposition: A | Discharge status: Home / Self Care | Payer: Medicare PPO | Source: Ambulatory Visit | Attending: Cardiology | Admitting: Cardiology

## 2019-09-30 DIAGNOSIS — R072 Precordial pain: Secondary | ICD-10-CM | POA: Insufficient documentation

## 2019-09-30 MED ORDER — METOPROLOL TARTRATE 5 MG/5ML IV SOLN
INTRAVENOUS | Status: AC
  Filled 2019-09-30: qty 5

## 2019-09-30 MED ORDER — NITROGLYCERIN 0.4 MG SL SUBL
0.8000 mg | SUBLINGUAL_TABLET | Freq: Once | SUBLINGUAL | Status: AC
  Administered 2019-09-30: 0.8 mg via SUBLINGUAL

## 2019-09-30 MED ORDER — IOHEXOL 350 MG/ML SOLN
100.0000 mL | Freq: Once | INTRAVENOUS | Status: AC | PRN
  Administered 2019-09-30: 16:00:00 100 mL via INTRAVENOUS

## 2019-09-30 MED ORDER — NITROGLYCERIN 0.4 MG SL SUBL
SUBLINGUAL_TABLET | SUBLINGUAL | Status: AC
  Filled 2019-09-30: qty 2

## 2019-10-01 ENCOUNTER — Ambulatory Visit (INDEPENDENT_AMBULATORY_CARE_PROVIDER_SITE_OTHER): Payer: Medicare PPO

## 2019-10-01 DIAGNOSIS — R072 Precordial pain: Secondary | ICD-10-CM

## 2019-10-01 NOTE — Progress Notes (Signed)
Complete echocardiogram has been performed.  Jimmy Nannette Zill RDCS, RVT 

## 2019-12-03 ENCOUNTER — Other Ambulatory Visit: Payer: Self-pay

## 2019-12-03 DIAGNOSIS — E0590 Thyrotoxicosis, unspecified without thyrotoxic crisis or storm: Secondary | ICD-10-CM | POA: Insufficient documentation

## 2019-12-03 DIAGNOSIS — C439 Malignant melanoma of skin, unspecified: Secondary | ICD-10-CM | POA: Insufficient documentation

## 2019-12-08 ENCOUNTER — Other Ambulatory Visit: Payer: Self-pay

## 2019-12-08 ENCOUNTER — Encounter: Payer: Self-pay | Admitting: Cardiology

## 2019-12-08 ENCOUNTER — Ambulatory Visit: Payer: Medicare PPO | Admitting: Cardiology

## 2019-12-08 VITALS — BP 142/90 | HR 73 | Ht 61.0 in | Wt 148.0 lb

## 2019-12-08 DIAGNOSIS — R5383 Other fatigue: Secondary | ICD-10-CM

## 2019-12-08 DIAGNOSIS — I10 Essential (primary) hypertension: Secondary | ICD-10-CM

## 2019-12-08 DIAGNOSIS — E782 Mixed hyperlipidemia: Secondary | ICD-10-CM

## 2019-12-08 DIAGNOSIS — R400 Somnolence: Secondary | ICD-10-CM | POA: Diagnosis not present

## 2019-12-08 MED ORDER — TELMISARTAN 40 MG PO TABS: ORAL_TABLET | ORAL | 3 refills | Status: DC

## 2019-12-08 NOTE — Progress Notes (Signed)
Cardiology Office Note:    Date:  12/08/2019   ID:  Samantha Anderson, DOB 01/18/1951, MRN [id number]  PCP:  Mateo Flow, MD  Cardiologist:  Berniece Salines, DO  Electrophysiologist:  None   Referring MD: Mateo Flow, MD   " I am   History of Present Illness:    Samantha Anderson is a 69 y.o. female with a hx of hypertension, hyperlipidemia, hypothyroidism presented initially on August 18, 2019 to be evaluated for chest pain.  At her visit the patient blood pressure was elevated at that time she was only on metoprolol succinate 25 mg daily.  She has been on telmisartan-hydrochlorothiazide which the patient reported that made her hypotensive.  Given her elevated blood pressure I restarted the patient on telmisartan 40 mg daily.  Therefore her regimen was now metoprolol succinate 25 mg daily and telmisartan 40 mg daily.  I did see the patient on 08/30/2019 at that time she complained of chest pain therefore I recommended she undergo a CCTA.  In the interim she was able to get this testing done which showed no evidence of CAD.   She is here today for a follow up visit.  She has been fatigue significantly recently and notes daytime somnolence.  She stopped the atorvastatin given the fact that she had coronary calcium score 0.   Past Medical History:  Diagnosis Date  . Benign essential hypertension   . Cancer ([place]) 2006   melanoma  . Chest pain   . Elevated LDL cholesterol level   . Hypothyroidism   . Thyroid disease    hyperthyroidism  . Vitamin D deficiency     Past Surgical History:  Procedure Laterality Date  . CESAREAN SECTION      Current Medications: Current Meds  Medication Sig  . Ascorbic Acid (VITAMIN C) 500 MG CAPS Take 500 mg by mouth daily.  Marland Kitchen aspirin EC 81 MG tablet Take 81 mg by mouth daily.  Marland Kitchen atorvastatin (LIPITOR) 20 MG tablet 20 mg daily.  . Cholecalciferol (VITAMIN D3) 125 MCG (5000 UT) CAPS Take 10,000 Units by mouth daily.  Marland Kitchen MAGNESIUM GLYCINATE PO Take  120 mg by mouth daily.  . metoprolol succinate (TOPROL-XL) 25 MG 24 hr tablet Take 25 mg by mouth daily.   Marland Kitchen SYNTHROID 100 MCG tablet 100 mcg daily.  Marland Kitchen telmisartan-hydrochlorothiazide (MICARDIS HCT) 80-12.5 MG tablet Take 1 tablet by mouth daily.  . traMADol (ULTRAM) 50 MG tablet Take 1 tablet by mouth every 6 (six) hours as needed.  . vitamin k 100 MCG tablet Take 100 mcg by mouth daily.  . Zinc Sulfate (ZINC 15 PO) Take 15 mg by mouth daily.  . [DISCONTINUED] omeprazole (PRILOSEC) 20 MG capsule 20 mg daily.  . [DISCONTINUED] ondansetron (ZOFRAN-ODT) 8 MG disintegrating tablet Take 1 tablet by mouth every 8 (eight) hours.  . [DISCONTINUED] telmisartan (MICARDIS) 40 MG tablet Take 1 tablet (40 mg total) by mouth daily.  . [DISCONTINUED] valsartan-hydrochlorothiazide (DIOVAN-HCT) 160-12.5 MG tablet Take 1 tablet by mouth daily.     Allergies:   Patient has no known allergies.   Social History   Socioeconomic History  . Marital status: Married    Spouse name: Not on file  . Number of children: Not on file  . Years of education: Not on file  . Highest education level: Not on file  Occupational History  . Not on file  Tobacco Use  . Smoking status: Never Smoker  . Smokeless tobacco: Never Used  Substance and Sexual Activity  . Alcohol use: Yes    Comment: occassionally  . Drug use: No  . Sexual activity: Yes    Birth control/protection: Post-menopausal  Other Topics Concern  . Not on file  Social History Narrative  . Not on file   Social Determinants of Health   Financial Resource Strain:   . Difficulty of Paying Living Expenses:   Food Insecurity:   . Worried About Charity fundraiser in the Last Year:   . Arboriculturist in the Last Year:   Transportation Needs:   . Film/video editor (Medical):   Marland Kitchen Lack of Transportation (Non-Medical):   Physical Activity:   . Days of Exercise per Week:   . Minutes of Exercise per Session:   Stress:   . Feeling of Stress :     Social Connections:   . Frequency of Communication with Friends and Family:   . Frequency of Social Gatherings with Friends and Family:   . Attends Religious Services:   . Active Member of Clubs or Organizations:   . Attends Archivist Meetings:   Marland Kitchen Marital Status:      Family History: The patient's family history includes Aneurysm in her father; Cancer in her father, maternal uncle, and paternal uncle; Hypercholesterolemia in her brother and sister; Hypertension in her brother, father, and sister; Osteoporosis in her mother.  ROS:   Review of Systems  Constitution: Negative for decreased appetite, fever and weight gain.  HENT: Negative for congestion, ear discharge, hoarse voice and sore throat.   Eyes: Negative for discharge, redness, vision loss in right eye and visual halos.  Cardiovascular: Negative for chest pain, dyspnea on exertion, leg swelling, orthopnea and palpitations.  Respiratory: Negative for cough, hemoptysis, shortness of breath and snoring.   Endocrine: Negative for heat intolerance and polyphagia.  Hematologic/Lymphatic: Negative for bleeding problem. Does not bruise/bleed easily.  Skin: Negative for flushing, nail changes, rash and suspicious lesions.  Musculoskeletal: Negative for arthritis, joint pain, muscle cramps, myalgias, neck pain and stiffness.  Gastrointestinal: Negative for abdominal pain, bowel incontinence, diarrhea and excessive appetite.  Genitourinary: Negative for decreased libido, genital sores and incomplete emptying.  Neurological: Negative for brief paralysis, focal weakness, headaches and loss of balance.  Psychiatric/Behavioral: Negative for altered mental status, depression and suicidal ideas.  Allergic/Immunologic: Negative for HIV exposure and persistent infections.    EKGs/Labs/Other Studies Reviewed:    The following studies were reviewed today:   EKG: None today  CCTA impression  1. Coronary calcium score of 0. This  was 0 percentile for age and sex matched control.  2. Normal coronary origin with right dominance.  3. No evidence of CAD.  Echo IMPRESSIONS  1. Left ventricular ejection fraction, by estimation, is 60 to 65%. The  left ventricle has normal function. The left ventricle has no regional  wall motion abnormalities. There is mild concentric left ventricular  hypertrophy. Left ventricular diastolic  parameters are consistent with Grade I diastolic dysfunction (impaired  relaxation).  2. Right ventricular systolic function is normal. The right ventricular  size is normal. There is normal pulmonary artery systolic pressure.  3. The mitral valve is normal in structure. Mild mitral valve  regurgitation. No evidence of mitral stenosis.  4. The aortic valve is tricuspid. Aortic valve regurgitation is not  visualized. No aortic stenosis is present.  5. The inferior vena cava is normal in size with greater than 50%  respiratory variability, suggesting right atrial pressure  of 3 mmHg.   Recent Labs: 09/28/2019: BUN 17; Creatinine, Ser 0.80; Potassium 4.6; Sodium 140  Recent Lipid Panel No results found for: CHOL, TRIG, HDL, CHOLHDL, VLDL, LDLCALC, LDLDIRECT  Physical Exam:    VS:  BP (!) 142/90   Pulse 73   Ht 5\' 1"  (1.549 m)   Wt 148 lb (67.1 kg)   SpO2 97%   BMI 27.96 kg/m     Wt Readings from Last 3 Encounters:  12/08/19 148 lb (67.1 kg)  08/30/19 148 lb (67.1 kg)  08/18/19 149 lb (67.6 kg)     GEN: Well nourished, well developed in no acute distress HEENT: Normal NECK: No JVD; No carotid bruits LYMPHATICS: No lymphadenopathy CARDIAC: S1S2 noted,RRR, no murmurs, rubs, gallops RESPIRATORY:  Clear to auscultation without rales, wheezing or rhonchi  ABDOMEN: Soft, non-tender, non-distended, +bowel sounds, no guarding. EXTREMITIES: No edema, No cyanosis, no clubbing MUSCULOSKELETAL:  No deformity  SKIN: Warm and dry NEUROLOGIC:  Alert and oriented x 3,  non-focal PSYCHIATRIC:  Normal affect, good insight  ASSESSMENT:    1. Essential hypertension   2. Mixed hyperlipidemia   3. Fatigue, unspecified type   4. Daytime somnolence    PLAN:     1.  She did bring her home blood pressure monitor report for me which is averaging between 1 45-1 60 at this time I like to increase her telmisartan from 40 mg daily to 40 mg in the morning and 20 mg at nighttime.  She will still continue on her metoprolol succinate 25 mg daily.    2.  Concern that her daytime somnolence may be due to sleep apnea.  Therefore I recommended the patient undergo a sleep study.  She admits to great anxiety of not being able to sleep outside of her house therefore I think she may be a good candidate for home sleep study.  3.  Hyperlipidemia her LDL in February was 183-she has stopped taking her Lipitor.  She wants to do diet modification for now which is not unreasonable we will repeat the lipid profile in 3 months.  At that time if elevated I did tell the patient I would strongly suggest that she restart her statin.  The patient is in agreement with the above plan. The patient left the office in stable condition.  The patient will follow up in 6 months or sooner if needed.   Medication Adjustments/Labs and Tests Ordered: Current medicines are reviewed at length with the patient today.  Concerns regarding medicines are outlined above.  No orders of the defined types were placed in this encounter.  No orders of the defined types were placed in this encounter.   There are no Patient Instructions on file for this visit.   Adopting a Healthy Lifestyle.  Know what a healthy weight is for you (roughly BMI <25) and aim to maintain this   Aim for 7+ servings of fruits and vegetables daily   65-80+ fluid ounces of water or unsweet tea for healthy kidneys   Limit to max 1 drink of alcohol per day; avoid smoking/tobacco   Limit animal fats in diet for cholesterol and heart  health - choose grass fed whenever available   Avoid highly processed foods, and foods high in saturated/trans fats   Aim for low stress - take time to unwind and care for your mental health   Aim for 150 min of moderate intensity exercise weekly for heart health, and weights twice weekly for bone health  Aim for 7-9 hours of sleep daily   When it comes to diets, agreement about the perfect plan isnt easy to find, even among the experts. Experts at the [employer] developed an idea known as the Healthy Eating Plate. Just imagine a plate divided into logical, healthy portions.   The emphasis is on diet quality:   Load up on vegetables and fruits - one-half of your plate: Aim for color and variety, and remember that potatoes dont count.   Go for whole grains - one-quarter of your plate: Whole wheat, barley, wheat berries, quinoa, oats, brown rice, and foods made with them. If you want pasta, go with whole wheat pasta.   Protein power - one-quarter of your plate: Fish, chicken, beans, and nuts are all healthy, versatile protein sources. Limit red meat.   The diet, however, does go beyond the plate, offering a few other suggestions.   Use healthy plant oils, such as olive, canola, soy, corn, sunflower and peanut. Check the labels, and avoid partially hydrogenated oil, which have unhealthy trans fats.   If youre thirsty, drink water. Coffee and tea are good in moderation, but skip sugary drinks and limit milk and dairy products to one or two daily servings.   The type of carbohydrate in the diet is more important than the amount. Some sources of carbohydrates, such as vegetables, fruits, whole grains, and beans-are healthier than others.   Finally, stay active  Signed, Berniece Salines, DO  12/08/2019 11:36 AM    [place]

## 2019-12-08 NOTE — Patient Instructions (Signed)
Medication Instructions:  Your physician has recommended you make the following change in your medication:  START: Telmisartan 40 mg take one tablet by mouth daily in the morning, then take 1/2 tablet by mouth daily in the evening.  *If you need a refill on your cardiac medications before your next appointment, please call your pharmacy*   Lab Work: Your physician recommends that you return for lab work in: 3 MONTHS Lipids If you have labs (blood work) drawn today and your tests are completely normal, you will receive your results only by: Marland Kitchen MyChart Message (if you have MyChart) OR . A paper copy in the mail If you have any lab test that is abnormal or we need to change your treatment, we will call you to review the results.   Testing/Procedures: We have put in a referral for you to have a sleep study done. They will call you to schedule this once your insurance has approved it.    Follow-Up: At [facility], you and your health needs are our priority.  As part of our continuing mission to provide you with exceptional heart care, we have created designated Provider Care Teams.  These Care Teams include your primary Cardiologist (physician) and Advanced Practice Providers (APPs -  Physician Assistants and Nurse Practitioners) who all work together to provide you with the care you need, when you need it.  We recommend signing up for the patient portal called "MyChart".  Sign up information is provided on this After Visit Summary.  MyChart is used to connect with patients for Virtual Visits (Telemedicine).  Patients are able to view lab/test results, encounter notes, upcoming appointments, etc.  Non-urgent messages can be sent to your provider as well.   To learn more about what you can do with MyChart, go to [email or web address].    Your next appointment:   6 month(s)  The format for your next appointment:   In Person  Provider:   Berniece Salines, DO   Other Instructions

## 2019-12-09 ENCOUNTER — Telehealth: Payer: Self-pay | Admitting: *Deleted

## 2019-12-09 NOTE — Telephone Encounter (Signed)
-----   Message from Gita Kudo, RN sent at 12/08/2019 11:37 AM EDT ----- Please schedule patient for a split night sleep study for fatigue per Dr. Harriet Masson.  Thanks,  Lilia Pro, RN

## 2019-12-17 ENCOUNTER — Telehealth: Payer: Self-pay | Admitting: *Deleted

## 2019-12-17 NOTE — Telephone Encounter (Signed)
PA submitted for sleep study to [facility] via web portal. Clinicals faxed.

## 2019-12-22 DIAGNOSIS — Z809 Family history of malignant neoplasm, unspecified: Secondary | ICD-10-CM | POA: Insufficient documentation

## 2020-01-05 ENCOUNTER — Telehealth: Payer: Self-pay

## 2020-01-05 NOTE — Telephone Encounter (Signed)
-----   Message from Ashok Norris, RN sent at 01/05/2020 10:33 AM EDT ----- Regarding: FW: Sleep study  ----- Message ----- From: Loel Dubonnet, NP Sent: 12/31/2019   4:02 PM EDT To: Ashok Norris, RN, Truddie Hidden, RN, # Subject: Sleep study                                    Per Barry Brunner:  [employer] approved sleep study. please call [phone] to schedule. We Just do the pre certs.  [facility] Auth # [id number]. Valid dates 01/03/20 to 02/02/20.  ---  Hi ladies,  Mariann Laster sent a message to [place] who sent a message to me. Will you three please work together to help Miss Bree get her sleep study scheduled? Thank you so much!  Loel Dubonnet, NP

## 2020-01-05 NOTE — Telephone Encounter (Signed)
Spoke to the just now and let her know that she is scheduled for a sleep study on 01/29/20 and will need to arrive to the clinic at 8 pm. I also let her know that a packet would be on the way to her house with all the needed information from the sleep disorders clinic. She verbalizes understanding and thanks me for the call back.   Encouraged patient to call back with any questions or concerns.

## 2020-01-29 ENCOUNTER — Other Ambulatory Visit: Payer: Self-pay

## 2020-01-29 ENCOUNTER — Ambulatory Visit (HOSPITAL_BASED_OUTPATIENT_CLINIC_OR_DEPARTMENT_OTHER): Payer: Medicare PPO | Attending: Cardiology | Admitting: Cardiovascular Disease

## 2020-01-29 DIAGNOSIS — R5383 Other fatigue: Secondary | ICD-10-CM | POA: Diagnosis not present

## 2020-01-29 DIAGNOSIS — G4733 Obstructive sleep apnea (adult) (pediatric): Secondary | ICD-10-CM | POA: Diagnosis not present

## 2020-02-12 ENCOUNTER — Encounter (HOSPITAL_BASED_OUTPATIENT_CLINIC_OR_DEPARTMENT_OTHER): Payer: Self-pay | Admitting: Cardiovascular Disease

## 2020-02-12 NOTE — Procedures (Signed)
Patient Name: Samantha Anderson, Samantha Anderson Date: 01/29/2020 Gender: Female D.O.B: 04/04/1951 Age (years): 68 Referring Provider: Godfrey Pick Tobb DO Height (inches): 61 Interpreting Physician: Shelva Majestic MD, ABSM Weight (lbs): 148 RPSGT: Earney [place] BMI: 28 MRN: [id number] Neck Size: 13.50  CLINICAL INFORMATION Sleep Study Type: NPSG  Indication for sleep study: Morning Headaches, Snoring  Epworth Sleepiness Score: 3  SLEEP STUDY TECHNIQUE As per the AASM Manual for the Scoring of Sleep and Associated Events v2.3 (April 2016) with a hypopnea requiring 4% desaturations.  The channels recorded and monitored were frontal, central and occipital EEG, electrooculogram (EOG), submentalis EMG (chin), nasal and oral airflow, thoracic and abdominal wall motion, anterior tibialis EMG, snore microphone, electrocardiogram, and pulse oximetry.  MEDICATIONS Ascorbic Acid (VITAMIN C) 500 MG CAPS aspirin EC 81 MG tablet atorvastatin (LIPITOR) 20 MG tablet Cholecalciferol (VITAMIN D3) 125 MCG (5000 UT) CAPS MAGNESIUM GLYCINATE PO metoprolol succinate (TOPROL-XL) 25 MG 24 hr tablet SYNTHROID 100 MCG tablet telmisartan (MICARDIS) 40 MG tablet traMADol (ULTRAM) 50 MG tablet vitamin k 100 MCG tablet Medications self-administered by patient taken the night of the study : [place], TELMISARTAN, ALUMINA W/MAGNESIUM CARBONATE, NETOPROLOL  SLEEP ARCHITECTURE The study was initiated at 9:54:39 PM and ended at 4:39:23 AM.  Sleep onset time was 25.4 minutes and the sleep efficiency was 62.9%. The total sleep time was 254.5 minutes.  Stage REM latency was 295.5 minutes.  The patient spent 19.45% of the night in stage N1 sleep, 57.37% in stage N2 sleep, 19.45% in stage N3 and 3.7% in REM.  Alpha intrusion was absent.  Supine sleep was 81.14%.  RESPIRATORY PARAMETERS The overall apnea/hypopnea index (AHI) was 33.9 per hour. The respiratory disturbance index (RDI) was 40.3/h.  There were 68 total apneas, including 54 obstructive, 11 central and 3 mixed apneas. There were 76 hypopneas and 27 RERAs.  The AHI during Stage REM sleep was 82.1 per hour.  AHI while supine was 41.8 per hour.  The mean oxygen saturation was 94.29%. The minimum SpO2 during sleep was 83.00%.  Loud snoring was noted during this study.  CARDIAC DATA The 2 lead EKG demonstrated sinus rhythm. The mean heart rate was 57.23 beats per minute. Other EKG findings include: None.  LEG MOVEMENT DATA The total PLMS were 20 with a resulting PLMS index of 4.72. Associated arousal with leg movement index was 2.1 .  IMPRESSIONS - Severe obstructive sleep apnea occurred during this study (AHI 33.9/h; RDI 40.3/h); events were very severe during REM sleep (AHI 82.1/h) - No significant central sleep apnea occurred during this study (CAI = 2.6/h). - Moderate oxygen desaturation to a nadir of 83.00%. - The patient snored with loud snoring volume. - Reduced sleep efficiency at 62.9%. - No cardiac abnormalities were noted during this study. - Clinically significant periodic limb movements did not occur during sleep. No significant associated arousals.  DIAGNOSIS - Obstructive Sleep Apnea (G47.33)  RECOMMENDATIONS - Therapeutic CPAP titration to determine optimal pressure required to alleviate sleep disordered breathing. - Effort should be made to optimize nasal and oropharyngeal patency. - Positional therapy avoiding supine position during sleep. - Avoid alcohol, sedatives and other CNS depressants that may worsen sleep apnea and disrupt normal sleep architecture. - Sleep hygiene should be reviewed to assess factors that may improve sleep quality. - Weight management and regular exercise should be initiated or continued if appropriate.  [Electronically signed] 02/12/2020 10:32 AM  Shelva Majestic MD, FACC,ABSM Diplomate, American Board of Sleep Medicine  NPI: [id number] [place] PH: [phone]   FX: [phone] [place]

## 2020-04-13 ENCOUNTER — Telehealth: Payer: Self-pay | Admitting: *Deleted

## 2020-04-13 ENCOUNTER — Other Ambulatory Visit: Payer: Self-pay | Admitting: Cardiovascular Disease

## 2020-04-13 DIAGNOSIS — G4736 Sleep related hypoventilation in conditions classified elsewhere: Secondary | ICD-10-CM

## 2020-04-13 DIAGNOSIS — G4733 Obstructive sleep apnea (adult) (pediatric): Secondary | ICD-10-CM

## 2020-04-13 NOTE — Telephone Encounter (Signed)
Patient notified of sleep study results and recommendations. She agrees to titration study. All questions were answered to patient's satisfaction. She has no further questions at this time.

## 2020-04-13 NOTE — Telephone Encounter (Signed)
PA for CPAP titration submitted to [employer] via web portal. 

## 2020-04-17 ENCOUNTER — Telehealth: Payer: Self-pay | Admitting: *Deleted

## 2020-04-17 NOTE — Telephone Encounter (Signed)
-----   Message from Lauralee Evener, [place] sent at 04/13/2020  4:01 PM EDT ----- Cpap titration

## 2020-04-17 NOTE — Telephone Encounter (Signed)
Checked CPAP titration PA request status via [employer] portal. Still pending. Tracking # [id number].

## 2020-04-20 DIAGNOSIS — L209 Atopic dermatitis, unspecified: Secondary | ICD-10-CM | POA: Diagnosis not present

## 2020-04-20 DIAGNOSIS — L814 Other melanin hyperpigmentation: Secondary | ICD-10-CM | POA: Diagnosis not present

## 2020-04-20 DIAGNOSIS — D225 Melanocytic nevi of trunk: Secondary | ICD-10-CM | POA: Diagnosis not present

## 2020-04-20 DIAGNOSIS — D2239 Melanocytic nevi of other parts of face: Secondary | ICD-10-CM | POA: Diagnosis not present

## 2020-04-20 DIAGNOSIS — L299 Pruritus, unspecified: Secondary | ICD-10-CM | POA: Diagnosis not present

## 2020-04-21 ENCOUNTER — Telehealth: Payer: Self-pay | Admitting: Cardiology

## 2020-04-21 NOTE — Telephone Encounter (Signed)
Prior auth approved Ref [id number].  Ready to schedule

## 2020-04-21 NOTE — Telephone Encounter (Signed)
-----   Message from Lauralee Evener, [place] sent at 04/13/2020  4:01 PM EDT ----- Cpap titration

## 2020-05-01 ENCOUNTER — Other Ambulatory Visit: Payer: Self-pay | Admitting: Cardiology

## 2020-05-01 MED ORDER — TELMISARTAN 40 MG PO TABS: ORAL_TABLET | ORAL | 3 refills | Status: DC

## 2020-05-01 MED ORDER — METOPROLOL SUCCINATE ER 25 MG PO TB24: 25.0000 mg | ORAL_TABLET | Freq: Every day | ORAL | 2 refills | Status: DC

## 2020-05-01 NOTE — Telephone Encounter (Signed)
°*  STAT* If patient is at the pharmacy, call can be transferred to refill team.   1. Which medications need to be refilled? (please list name of each medication and dose if known)   telmisartan (MICARDIS) 40 MG tablet metoprolol succinate (TOPROL-XL) 25 MG 24 hr tablet  2. Which pharmacy/location (including street and city if local pharmacy) is medication to be sent to  Both to  [place], [place]  3. Do they need a 30 day or 90 day supply?   30 day  30 day   Patient states she only has 6 pills left of metoprolol. She states Dr. Sula Rumple originally prescribed this medication but Dr. Harriet Masson continued it so she would like to know if he can send in the refill for both medications. She has no refills left - 6 month follow up is scheduled for 06/13/2020.

## 2020-05-05 ENCOUNTER — Telehealth: Payer: Self-pay | Admitting: *Deleted

## 2020-05-05 DIAGNOSIS — Z20822 Contact with and (suspected) exposure to covid-19: Secondary | ICD-10-CM | POA: Diagnosis not present

## 2020-05-05 NOTE — Telephone Encounter (Addendum)
Reita Cliche, CMA Prior auth approved Ref #[id number]. Ready to schedule          ----- Message -----  From: Lauralee Evener, CMA  Sent: 04/13/2020  4:01 PM EDT  To: [facility] Sleep Studies   Cpap titration    Patient is scheduled for CPAP Titration on 06/11/20. Patient understands her titration study will be done at [facility] sleep lab. Patient understands she will receive a letter in a week or so detailing appointment, date, time, and location. Patient understands to call if she does not receive the letter  in a timely manner. Patient agrees with treatment and thanked me for call.

## 2020-06-08 ENCOUNTER — Other Ambulatory Visit: Payer: Self-pay

## 2020-06-08 DIAGNOSIS — E039 Hypothyroidism, unspecified: Secondary | ICD-10-CM | POA: Insufficient documentation

## 2020-06-08 DIAGNOSIS — R079 Chest pain, unspecified: Secondary | ICD-10-CM | POA: Insufficient documentation

## 2020-06-08 DIAGNOSIS — E7800 Pure hypercholesterolemia, unspecified: Secondary | ICD-10-CM | POA: Insufficient documentation

## 2020-06-08 DIAGNOSIS — I10 Essential (primary) hypertension: Secondary | ICD-10-CM | POA: Insufficient documentation

## 2020-06-11 ENCOUNTER — Ambulatory Visit (HOSPITAL_BASED_OUTPATIENT_CLINIC_OR_DEPARTMENT_OTHER): Payer: Medicare PPO | Attending: Cardiovascular Disease | Admitting: Cardiovascular Disease

## 2020-06-12 ENCOUNTER — Ambulatory Visit: Payer: Medicare PPO | Admitting: Cardiology

## 2020-06-13 ENCOUNTER — Ambulatory Visit: Payer: Medicare PPO | Admitting: Cardiology

## 2020-06-26 DIAGNOSIS — Z6828 Body mass index (BMI) 28.0-28.9, adult: Secondary | ICD-10-CM | POA: Diagnosis not present

## 2020-06-26 DIAGNOSIS — J029 Acute pharyngitis, unspecified: Secondary | ICD-10-CM | POA: Diagnosis not present

## 2020-06-26 DIAGNOSIS — G4733 Obstructive sleep apnea (adult) (pediatric): Secondary | ICD-10-CM | POA: Diagnosis not present

## 2020-06-26 DIAGNOSIS — Z23 Encounter for immunization: Secondary | ICD-10-CM | POA: Diagnosis not present

## 2020-07-11 DIAGNOSIS — G4733 Obstructive sleep apnea (adult) (pediatric): Secondary | ICD-10-CM | POA: Diagnosis not present

## 2020-07-12 ENCOUNTER — Encounter: Payer: Self-pay | Admitting: Cardiology

## 2020-07-12 ENCOUNTER — Other Ambulatory Visit: Payer: Self-pay

## 2020-07-12 ENCOUNTER — Ambulatory Visit: Payer: Medicare PPO | Admitting: Cardiology

## 2020-07-12 VITALS — BP 124/88 | HR 70 | Ht 62.0 in | Wt 152.2 lb

## 2020-07-12 DIAGNOSIS — I10 Essential (primary) hypertension: Secondary | ICD-10-CM

## 2020-07-12 DIAGNOSIS — E782 Mixed hyperlipidemia: Secondary | ICD-10-CM | POA: Diagnosis not present

## 2020-07-12 DIAGNOSIS — G4733 Obstructive sleep apnea (adult) (pediatric): Secondary | ICD-10-CM | POA: Diagnosis not present

## 2020-07-12 MED ORDER — METOPROLOL SUCCINATE ER 25 MG PO TB24: 25.0000 mg | ORAL_TABLET | Freq: Every day | ORAL | 3 refills | Status: DC

## 2020-07-12 MED ORDER — TELMISARTAN 40 MG PO TABS: ORAL_TABLET | ORAL | 3 refills | Status: DC

## 2020-07-12 NOTE — Patient Instructions (Signed)
Medication Instructions:  Your physician recommends that you continue on your current medications as directed. Please refer to the Current Medication list given to you today.  *If you need a refill on your cardiac medications before your next appointment, please call your pharmacy*  Follow-Up: At [facility], you and your health needs are our priority.  As part of our continuing mission to provide you with exceptional heart care, we have created designated Provider Care Teams.  These Care Teams include your primary Cardiologist (physician) and Advanced Practice Providers (APPs -  Physician Assistants and Nurse Practitioners) who all work together to provide you with the care you need, when you need it.  Your next appointment:   6 month(s)  The format for your next appointment:   In Person  Provider:   Dr. Selmer Dominion

## 2020-07-12 NOTE — Progress Notes (Signed)
Cardiology Office Note:    Date:  07/12/2020   ID:  Samantha Anderson, DOB [birth year], MRN [id number]  PCP:  Lise Auer, MD  Cardiologist:  Thomasene Ripple, DO  Electrophysiologist:  None   Referring MD: Lise Auer, MD   " I am doing fine"  History of Present Illness:    Samantha Anderson is a 70 y.o. female with a hx of hypertension, hyperlipidemia, hypothyroidism presented initially on August 18, 2019 to be evaluated for chest pain. At her visit the patient blood pressure was elevated at that time she was only on metoprolol succinate 25 mg daily. She has been on telmisartan-hydrochlorothiazide which the patient reported that made her hypotensive. Given her elevated blood pressure I restarted the patient on telmisartan 40 mg daily. Therefore her regimen was now metoprolol succinate 25 mg daily and telmisartan 40 mg daily.  I did see the patient on 08/30/2019 at that time she complained of chest pain therefore I recommended she undergo a CCTA.  The patient did get a coronary CTA which reported calcium score 0 - stopped after atorvastatin.  At her last visit increase telmisartan to 40 mg in the morning and 20 mg at nighttime.  We continue her Toprol XL 25 mg daily.  In the meantime she was able to get her sleep study done which show evidence of severe obstructive sleep apnea.  She is still pending follow-up for her CPAP titration.  She tells me that she is unable to drive to [place] during this time given the fact that it is usually dark when she leaves and by the time she get back is going to be doctors well.  She discussed with her PCP who told her he was going to order her CPAP machine.  Advised the patient that given her severe sleep apnea she would need to be titrated with her CPAP to get a better suited number for her.   Past Medical History:  Diagnosis Date  . Benign essential hypertension   . Cancer (HCC) 2006   melanoma  . Chest pain   . Elevated LDL cholesterol level   .  Hypothyroidism   . Thyroid disease    hyperthyroidism  . Vitamin D deficiency     Past Surgical History:  Procedure Laterality Date  . CESAREAN SECTION      Current Medications: Current Meds  Medication Sig  . ALLERGY RELIEF 10 MG tablet Take 20 mg by mouth daily.  . Ascorbic Acid (VITAMIN C) 500 MG CAPS Take 500 mg by mouth daily.  Marland Kitchen aspirin EC 81 MG tablet Take 81 mg by mouth daily.  . Cholecalciferol (VITAMIN D3) 125 MCG (5000 UT) CAPS Take 10,000 Units by mouth daily.  Marland Kitchen MAGNESIUM GLYCINATE PO Take 120 mg by mouth daily.  Marland Kitchen neomycin-polymyxin b-dexamethasone (MAXITROL) 3.5-10000-0.1 OINT SMARTSIG:Sparingly In Eye(s) Every Night  . SYNTHROID 100 MCG tablet 100 mcg daily.  . vitamin k 100 MCG tablet Take 100 mcg by mouth daily.  . Zinc Sulfate (ZINC 15 PO) Take 15 mg by mouth daily.  . [DISCONTINUED] metoprolol succinate (TOPROL-XL) 25 MG 24 hr tablet Take 1 tablet (25 mg total) by mouth daily.  . [DISCONTINUED] telmisartan (MICARDIS) 40 MG tablet Please take one tablet ( 40 mg ) total in the morning. Then take 1/2 tablet (20 mg ) total in the evening.     Allergies:   Patient has no known allergies.   Social History   Socioeconomic History  . Marital status:  Married    Spouse name: Not on file  . Number of children: Not on file  . Years of education: Not on file  . Highest education level: Not on file  Occupational History  . Not on file  Tobacco Use  . Smoking status: Never Smoker  . Smokeless tobacco: Never Used  Substance and Sexual Activity  . Alcohol use: Yes    Comment: occassionally  . Drug use: No  . Sexual activity: Yes    Birth control/protection: Post-menopausal  Other Topics Concern  . Not on file  Social History Narrative  . Not on file   Social Determinants of Health   Financial Resource Strain: Not on file  Food Insecurity: Not on file  Transportation Needs: Not on file  Physical Activity: Not on file  Stress: Not on file  Social  Connections: Not on file     Family History: The patient's family history includes Aneurysm in her father; Cancer in her father, maternal uncle, and paternal uncle; Hypercholesterolemia in her brother and sister; Hypertension in her brother, father, and sister; Osteoporosis in her mother.  ROS:   Review of Systems  Constitution: Negative for decreased appetite, fever and weight gain.  HENT: Negative for congestion, ear discharge, hoarse voice and sore throat.   Eyes: Negative for discharge, redness, vision loss in right eye and visual halos.  Cardiovascular: Negative for chest pain, dyspnea on exertion, leg swelling, orthopnea and palpitations.  Respiratory: Negative for cough, hemoptysis, shortness of breath and snoring.   Endocrine: Negative for heat intolerance and polyphagia.  Hematologic/Lymphatic: Negative for bleeding problem. Does not bruise/bleed easily.  Skin: Negative for flushing, nail changes, rash and suspicious lesions.  Musculoskeletal: Negative for arthritis, joint pain, muscle cramps, myalgias, neck pain and stiffness.  Gastrointestinal: Negative for abdominal pain, bowel incontinence, diarrhea and excessive appetite.  Genitourinary: Negative for decreased libido, genital sores and incomplete emptying.  Neurological: Negative for brief paralysis, focal weakness, headaches and loss of balance.  Psychiatric/Behavioral: Negative for altered mental status, depression and suicidal ideas.  Allergic/Immunologic: Negative for HIV exposure and persistent infections.    EKGs/Labs/Other Studies Reviewed:    The following studies were reviewed today:   EKG: None today  CCTA impression  1. Coronary calcium score of 0. This was 0 percentile for age and sex matched control.  2. Normal coronary origin with right dominance.  3. No evidence of CAD.  Echo IMPRESSIONS  1. Left ventricular ejection fraction, by estimation, is 60 to 65%. The left ventricle has normal function.  The left ventricle has no regional wall motion abnormalities. There is mild concentric left ventricular hypertrophy. Left ventricular diastolic parameters are consistent with Grade I diastolic dysfunction (impaired relaxation).  2. Right ventricular systolic function is normal. The right ventricular size is normal. There is normal pulmonary artery systolic pressure.  3. The mitral valve is normal in structure. Mild mitral valve  regurgitation. No evidence of mitral stenosis.  4. The aortic valve is tricuspid. Aortic valve regurgitation is not  visualized. No aortic stenosis is present.  5. The inferior vena cava is normal in size with greater than 50%  respiratory variability, suggesting right atrial pressure of 3 mmHg.  Recent Labs: 09/28/2019: BUN 17; Creatinine, Ser 0.80; Potassium 4.6; Sodium 140  Recent Lipid Panel No results found for: CHOL, TRIG, HDL, CHOLHDL, VLDL, LDLCALC, LDLDIRECT  Physical Exam:    VS:  BP 124/88   Pulse 70   Ht 5\' 2"  (1.575 m)   Wt 152  lb 3.2 oz (69 kg)   SpO2 98%   BMI 27.84 kg/m     Wt Readings from Last 3 Encounters:  07/12/20 152 lb 3.2 oz (69 kg)  01/29/20 148 lb (67.1 kg)  12/08/19 148 lb (67.1 kg)     GEN: Well nourished, well developed in no acute distress HEENT: Normal NECK: No JVD; No carotid bruits LYMPHATICS: No lymphadenopathy CARDIAC: S1S2 noted,RRR, no murmurs, rubs, gallops RESPIRATORY:  Clear to auscultation without rales, wheezing or rhonchi  ABDOMEN: Soft, non-tender, non-distended, +bowel sounds, no guarding. EXTREMITIES: No edema, No cyanosis, no clubbing MUSCULOSKELETAL:  No deformity  SKIN: Warm and dry NEUROLOGIC:  Alert and oriented x 3, non-focal PSYCHIATRIC:  Normal affect, good insight  ASSESSMENT:    1. Essential hypertension   2. Mixed hyperlipidemia   3. Severe obstructive sleep apnea    PLAN:     Her blood pressure has improved we will continue patient on current medication regimen.  Unfortunately her  sleep apnea has not been treated yet.  I discussed with the patient is important for her CPAP to be titrated appropriately she is going to further discuss with her PCP and see if his office can do this titration and I discussed with her that if his office cannot do this titration it would be best that she go back to [facility] and get sleep study for titration.   The patient is in agreement with the above plan. The patient left the office in stable condition.  The patient will follow up in   Medication Adjustments/Labs and Tests Ordered: Current medicines are reviewed at length with the patient today.  Concerns regarding medicines are outlined above.  No orders of the defined types were placed in this encounter.  Meds ordered this encounter  Medications  . metoprolol succinate (TOPROL-XL) 25 MG 24 hr tablet    Sig: Take 1 tablet (25 mg total) by mouth daily.    Dispense:  90 tablet    Refill:  3  . telmisartan (MICARDIS) 40 MG tablet    Sig: Please take one tablet ( 40 mg ) total in the morning. Then take 1/2 tablet (20 mg ) total in the evening.    Dispense:  90 tablet    Refill:  3    Patient Instructions  Medication Instructions:  Your physician recommends that you continue on your current medications as directed. Please refer to the Current Medication list given to you today.  *If you need a refill on your cardiac medications before your next appointment, please call your pharmacy*  Follow-Up: At [facility], you and your health needs are our priority.  As part of our continuing mission to provide you with exceptional heart care, we have created designated Provider Care Teams.  These Care Teams include your primary Cardiologist (physician) and Advanced Practice Providers (APPs -  Physician Assistants and Nurse Practitioners) who all work together to provide you with the care you need, when you need it.  Your next appointment:   6 month(s)  The format for your next  appointment:   In Person  Provider:   Dr. Satira Sark       Adopting a Healthy Lifestyle.  Know what a healthy weight is for you (roughly BMI <25) and aim to maintain this   Aim for 7+ servings of fruits and vegetables daily   65-80+ fluid ounces of water or unsweet tea for healthy kidneys   Limit to max 1 drink of alcohol per day;  avoid smoking/tobacco   Limit animal fats in diet for cholesterol and heart health - choose grass fed whenever available   Avoid highly processed foods, and foods high in saturated/trans fats   Aim for low stress - take time to unwind and care for your mental health   Aim for 150 min of moderate intensity exercise weekly for heart health, and weights twice weekly for bone health   Aim for 7-9 hours of sleep daily   When it comes to diets, agreement about the perfect plan isnt easy to find, even among the experts. Experts at the [employer] developed an idea known as the Healthy Eating Plate. Just imagine a plate divided into logical, healthy portions.   The emphasis is on diet quality:   Load up on vegetables and fruits - one-half of your plate: Aim for color and variety, and remember that potatoes dont count.   Go for whole grains - one-quarter of your plate: Whole wheat, barley, wheat berries, quinoa, oats, brown rice, and foods made with them. If you want pasta, go with whole wheat pasta.   Protein power - one-quarter of your plate: Fish, chicken, beans, and nuts are all healthy, versatile protein sources. Limit red meat.   The diet, however, does go beyond the plate, offering a few other suggestions.   Use healthy plant oils, such as olive, canola, soy, corn, sunflower and peanut. Check the labels, and avoid partially hydrogenated oil, which have unhealthy trans fats.   If youre thirsty, drink water. Coffee and tea are good in moderation, but skip sugary drinks and limit milk and dairy products to one or two daily  servings.   The type of carbohydrate in the diet is more important than the amount. Some sources of carbohydrates, such as vegetables, fruits, whole grains, and beans-are healthier than others.   Finally, stay active  Signed, Berniece Salines, DO  07/12/2020 1:42 PM    [facility]

## 2020-07-24 ENCOUNTER — Telehealth: Payer: Self-pay

## 2020-07-24 ENCOUNTER — Telehealth: Payer: Self-pay | Admitting: Cardiology

## 2020-07-24 NOTE — Telephone Encounter (Signed)
   Pt c/o medication issue:  1. Name of Medication:   telmisartan (MICARDIS) 40 MG tablet    2. How are you currently taking this medication (dosage and times per day)?Please take one tablet ( 40 mg ) total in the morning. Then take 1/2 tablet (20 mg ) total in the evening.  3. Are you having a reaction (difficulty breathing--STAT)?   4. What is your medication issue? [employer] calling to get prior auth for this medications. She said she will refax the request today. Ref# [id number]

## 2020-07-24 NOTE — Telephone Encounter (Signed)
Called [employer] about the PA for the Telemisartan answered all the questions to get it sent through to [facility].  They will fax [place] a response within 3- 5 days.  I called the patient and let her know it had been took care of and as soon as we heard back we would give her a call and let her know what we needed to do next.

## 2020-07-27 NOTE — Telephone Encounter (Signed)
Prior Auth  Done over the phone for Telmisartan 40mg  and it's approved per [place] PA rep cofirmation#[id number]. Dates 07/08/20-07/07/2021. Patient notified.

## 2020-08-08 DIAGNOSIS — Z6828 Body mass index (BMI) 28.0-28.9, adult: Secondary | ICD-10-CM | POA: Diagnosis not present

## 2020-08-08 DIAGNOSIS — G4733 Obstructive sleep apnea (adult) (pediatric): Secondary | ICD-10-CM | POA: Diagnosis not present

## 2020-08-08 DIAGNOSIS — Z79899 Other long term (current) drug therapy: Secondary | ICD-10-CM | POA: Diagnosis not present

## 2020-08-08 DIAGNOSIS — E7800 Pure hypercholesterolemia, unspecified: Secondary | ICD-10-CM | POA: Diagnosis not present

## 2020-08-08 DIAGNOSIS — E039 Hypothyroidism, unspecified: Secondary | ICD-10-CM | POA: Diagnosis not present

## 2020-08-11 DIAGNOSIS — G4733 Obstructive sleep apnea (adult) (pediatric): Secondary | ICD-10-CM | POA: Diagnosis not present

## 2020-08-13 IMAGING — CT CT HEART MORP W/ CTA COR W/ SCORE W/ CA W/CM &/OR W/O CM
4 of 7 series · 8 of 20 positions shown, 9 images · IV contrast (APPLIED)
Comparison: None.
COMPARISON: None.

Addendum:
EXAM:
OVER-READ INTERPRETATION  CT CHEST

The following report is an over-read performed by radiologist Dr.
Nomasibulele Moatshe [REDACTED] on 09/30/2019. This
over-read does not include interpretation of cardiac or coronary
anatomy or pathology. The coronary calcium score/coronary CTA
interpretation by the cardiologist is attached.
CLINICAL DATA: 68 year old female with atypical angina. Medical
history includes hypertension and hyperlipidemia.
Cardiac/Coronary  CT
TECHNIQUE: The patient was scanned on a [employer] Force scanner.

[Series 6: best diast 73 % · axial · 0.37mm/px · z∈[-295,-254]mm · 2 of 306 slices shown, 3 images]
[im 102/306  vessel]
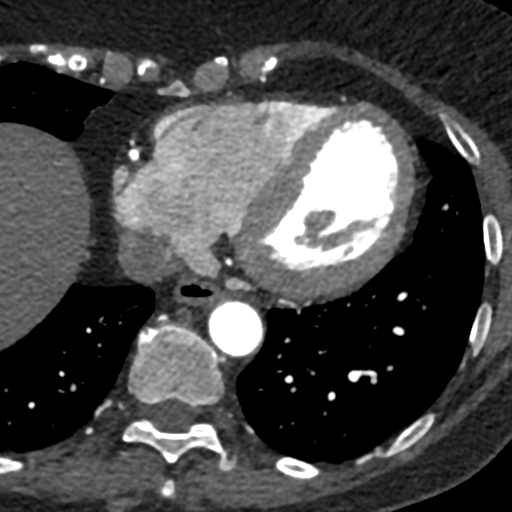
[im 102/306  lung]
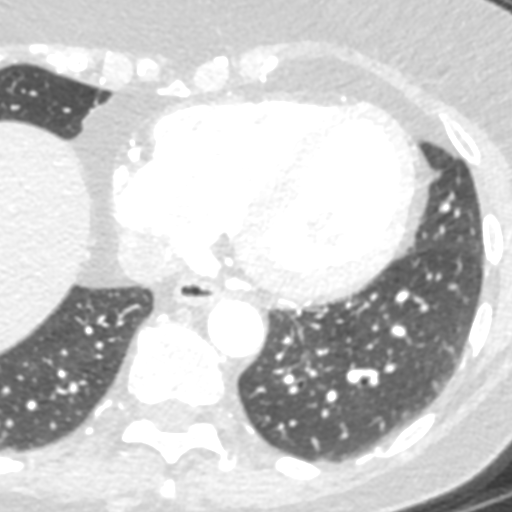
[im 204/306  vessel]
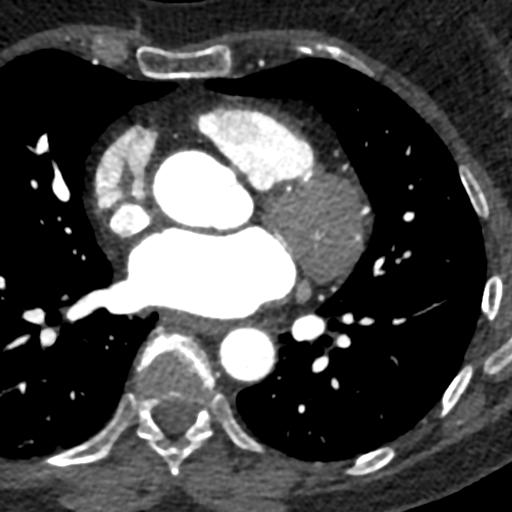

[Series 7: best syst 43 % · axial · 0.37mm/px · z∈[-295,-254]mm · 2 of 306 slices shown]
[im 102/306  vessel]
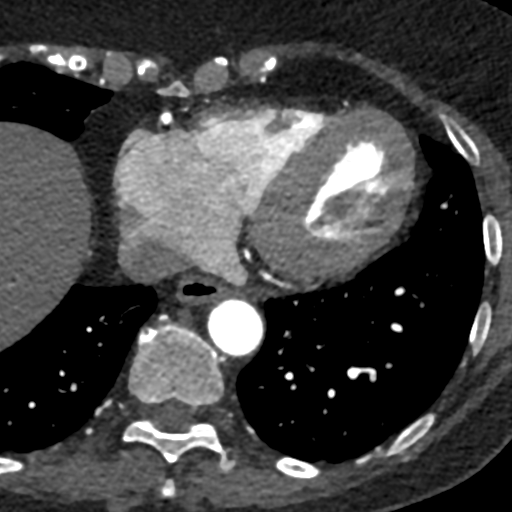
[im 204/306  vessel]
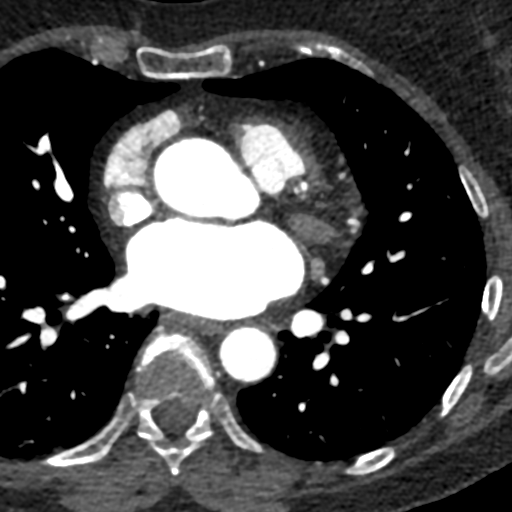

[Series 8: ts diast sharp 43 % · axial · 0.37mm/px · z∈[-295,-254]mm · 2 of 306 slices shown]
[im 102/306  lung]
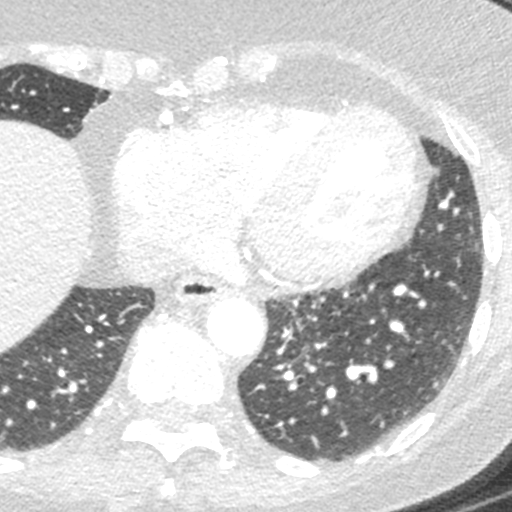
[im 204/306  lung]
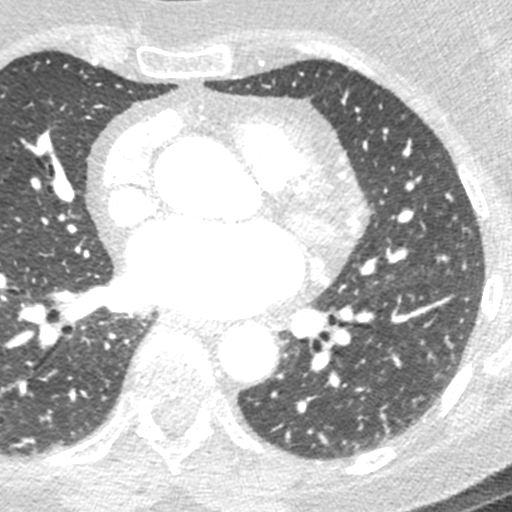

[Series 9: ts syst sharp 43 % · axial · 0.37mm/px · z∈[-295,-254]mm · 2 of 306 slices shown]
[im 102/306  lung]
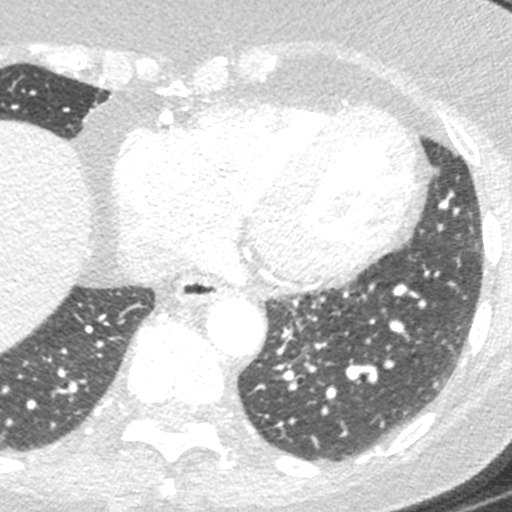
[im 204/306  lung]
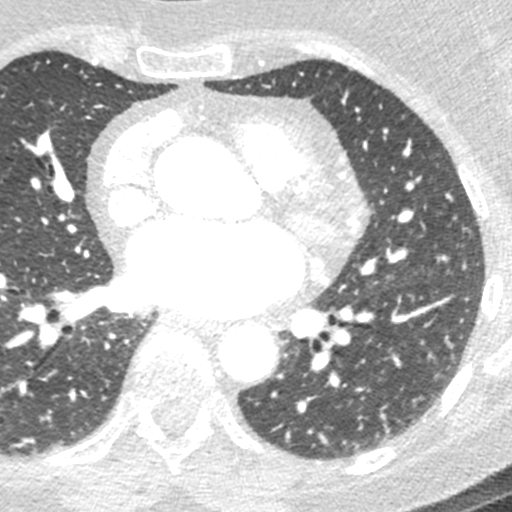

[8 of 20 positions shown; findings below may reference images not displayed]

FINDINGS: Within the visualized portions of the thorax there are no suspicious
appearing pulmonary nodules or masses, there is no acute
consolidative airspace disease, no pleural effusions, no
pneumothorax and no lymphadenopathy. Visualized portions of the
upper abdomen are unremarkable. There are no aggressive appearing
lytic or blastic lesions noted in the visualized portions of the
skeleton.
IMPRESSION: 1. No significant incidental noncardiac findings are noted.
FINDINGS: A 120 kV prospective scan was triggered in the descending thoracic
aorta at 111 HU's. Axial non-contrast 3 mm slices were carried out
through the heart. The data set was analyzed on a dedicated work
station and scored using the Agatson method. Gantry rotation speed
was 250 msecs and collimation was .6 mm. No beta blockade and 0.8 mg
of sl NTG was given. The 3D data set was reconstructed in 5%
intervals of the 67-82 % of the R-R cycle. Diastolic phases were
analyzed on a dedicated work station using MPR, MIP and VRT modes.
The patient received 80 cc of contrast.

Aorta: Normal size.  No calcifications.  No dissection.

Aortic Valve:  Trileaflet.  No calcifications.

Coronary Arteries:  Normal coronary origin.  Right dominance.

RCA is a large dominant artery that gives rise to PDA and PLVB.
There is no plaque.

Left main is a large artery that gives rise to LAD and LCX arteries.

LAD is a large vessel that has no plaque.

LCX is a non-dominant artery that gives rise to one large OM1
branch. There is no plaque.

Other findings:

Normal pulmonary vein drainage into the left atrium.

Normal left atrial appendage without a thrombus.

Normal size of the pulmonary artery.
IMPRESSION: 1. Coronary calcium score of 0. This was 0 percentile for age and
sex matched control.

2. Normal coronary origin with right dominance.

3. No evidence of CAD.

Bg Yumer Nurgul Minimal, DO

*** End of Addendum ***
EXAM:
OVER-READ INTERPRETATION  CT CHEST

The following report is an over-read performed by radiologist Dr.
Nomasibulele Moatshe [REDACTED] on 09/30/2019. This
over-read does not include interpretation of cardiac or coronary
anatomy or pathology. The coronary calcium score/coronary CTA
interpretation by the cardiologist is attached.
FINDINGS: Within the visualized portions of the thorax there are no suspicious
appearing pulmonary nodules or masses, there is no acute
consolidative airspace disease, no pleural effusions, no
pneumothorax and no lymphadenopathy. Visualized portions of the
upper abdomen are unremarkable. There are no aggressive appearing
lytic or blastic lesions noted in the visualized portions of the
skeleton.
IMPRESSION: 1. No significant incidental noncardiac findings are noted.

## 2020-09-05 DIAGNOSIS — Z8481 Family history of carrier of genetic disease: Secondary | ICD-10-CM | POA: Diagnosis not present

## 2020-09-05 DIAGNOSIS — Z1211 Encounter for screening for malignant neoplasm of colon: Secondary | ICD-10-CM | POA: Diagnosis not present

## 2020-09-05 DIAGNOSIS — Z01419 Encounter for gynecological examination (general) (routine) without abnormal findings: Secondary | ICD-10-CM | POA: Diagnosis not present

## 2020-09-05 DIAGNOSIS — Z6828 Body mass index (BMI) 28.0-28.9, adult: Secondary | ICD-10-CM | POA: Diagnosis not present

## 2020-09-05 DIAGNOSIS — E559 Vitamin D deficiency, unspecified: Secondary | ICD-10-CM | POA: Diagnosis not present

## 2020-09-05 DIAGNOSIS — M8580 Other specified disorders of bone density and structure, unspecified site: Secondary | ICD-10-CM | POA: Diagnosis not present

## 2020-09-05 DIAGNOSIS — Z1231 Encounter for screening mammogram for malignant neoplasm of breast: Secondary | ICD-10-CM | POA: Diagnosis not present

## 2020-09-08 DIAGNOSIS — G4733 Obstructive sleep apnea (adult) (pediatric): Secondary | ICD-10-CM | POA: Diagnosis not present

## 2020-10-09 DIAGNOSIS — G4733 Obstructive sleep apnea (adult) (pediatric): Secondary | ICD-10-CM | POA: Diagnosis not present

## 2020-10-25 DIAGNOSIS — G4733 Obstructive sleep apnea (adult) (pediatric): Secondary | ICD-10-CM | POA: Diagnosis not present

## 2020-10-31 DIAGNOSIS — L299 Pruritus, unspecified: Secondary | ICD-10-CM | POA: Diagnosis not present

## 2020-10-31 DIAGNOSIS — L209 Atopic dermatitis, unspecified: Secondary | ICD-10-CM | POA: Diagnosis not present

## 2020-11-08 DIAGNOSIS — G4733 Obstructive sleep apnea (adult) (pediatric): Secondary | ICD-10-CM | POA: Diagnosis not present

## 2020-12-09 DIAGNOSIS — G4733 Obstructive sleep apnea (adult) (pediatric): Secondary | ICD-10-CM | POA: Diagnosis not present

## 2020-12-27 MED ORDER — METOPROLOL SUCCINATE ER 25 MG PO TB24: 12.5000 mg | ORAL_TABLET | Freq: Every day | ORAL | 3 refills | Status: DC

## 2020-12-27 MED ORDER — TELMISARTAN 40 MG PO TABS: 40.0000 mg | ORAL_TABLET | Freq: Every day | ORAL | 3 refills | Status: DC

## 2021-01-08 DIAGNOSIS — G4733 Obstructive sleep apnea (adult) (pediatric): Secondary | ICD-10-CM | POA: Diagnosis not present

## 2021-01-09 ENCOUNTER — Encounter: Payer: Self-pay | Admitting: Cardiology

## 2021-01-09 ENCOUNTER — Other Ambulatory Visit: Payer: Self-pay

## 2021-01-09 ENCOUNTER — Ambulatory Visit: Payer: Medicare PPO | Admitting: Cardiology

## 2021-01-09 VITALS — BP 142/90 | HR 75 | Ht 62.0 in | Wt 147.4 lb

## 2021-01-09 DIAGNOSIS — G4733 Obstructive sleep apnea (adult) (pediatric): Secondary | ICD-10-CM | POA: Diagnosis not present

## 2021-01-09 DIAGNOSIS — I10 Essential (primary) hypertension: Secondary | ICD-10-CM

## 2021-01-09 DIAGNOSIS — E782 Mixed hyperlipidemia: Secondary | ICD-10-CM | POA: Diagnosis not present

## 2021-01-09 MED ORDER — TELMISARTAN 20 MG PO TABS: 20.0000 mg | ORAL_TABLET | Freq: Every day | ORAL | 3 refills | Status: DC

## 2021-01-09 NOTE — Progress Notes (Signed)
Cardiology Office Note:    Date:  01/09/2021   ID:  Samantha Anderson, DOB May 03, 1951, MRN [id number]  PCP:  Samantha Flow, MD  Cardiologist:  Samantha Salines, DO  Electrophysiologist:  None   Referring MD: Samantha Flow, MD   My blood pressure has been low  History of Present Illness:    Samantha Anderson is a 70 y.o. female with a hx of hypertension, hyperlipidemia, hypothyroidism, severe obstructive sleep apnea now on CPAP,.  Here today for follow-up visit.  I initially on August 18, 2019 to be evaluated for chest pain.  At her visit the patient blood pressure was elevated at that time she was only on metoprolol succinate 25 mg daily.  She has been on telmisartan-hydrochlorothiazide which the patient reported that made her hypotensive.  Given her elevated blood pressure I restarted the patient on telmisartan 40 mg daily.  Therefore her regimen was now metoprolol succinate 25 mg daily and telmisartan 40 mg daily.   I did see the patient on 08/30/2019 at that time she complained of chest pain therefore I recommended she undergo a CCTA.   The patient did get a coronary CTA which reported calcium score 0 - stopped after atorvastatin.  At her last visit increase telmisartan to 40 mg in the morning and 20 mg at nighttime.  We continue her Toprol XL 25 mg daily.     She does have a blood pressure reading from home which appears to be averaging between 120s and 130s.  She has had evenings where she did not take any medication due to systolic blood pressures in the low 100s.  Past Medical History:  Diagnosis Date   Benign essential hypertension    Cancer ([facility]) 2006   melanoma   Chest pain    Elevated LDL cholesterol level    Hypothyroidism    Thyroid disease    hyperthyroidism   Vitamin D deficiency     Past Surgical History:  Procedure Laterality Date   CESAREAN SECTION      Current Medications: Current Meds  Medication Sig   ALLERGY RELIEF 10 MG tablet Take 20 mg by mouth daily.    Ascorbic Acid (VITAMIN C) 500 MG CAPS Take 500 mg by mouth daily.   aspirin EC 81 MG tablet Take 81 mg by mouth daily.   Cholecalciferol (VITAMIN D3) 125 MCG (5000 UT) CAPS Take 10,000 Units by mouth daily.   MAGNESIUM GLYCINATE PO Take 120 mg by mouth daily.   metoprolol succinate (TOPROL-XL) 25 MG 24 hr tablet Take 0.5 tablets (12.5 mg total) by mouth daily.   neomycin-polymyxin b-dexamethasone (MAXITROL) 3.5-10000-0.1 OINT SMARTSIG:Sparingly In Eye(s) Every Night   SYNTHROID 100 MCG tablet 100 mcg daily.   telmisartan (MICARDIS) 20 MG tablet Take 1 tablet (20 mg total) by mouth daily.   vitamin k 100 MCG tablet Take 100 mcg by mouth daily.   Zinc Sulfate (ZINC 15 PO) Take 15 mg by mouth daily.   [DISCONTINUED] telmisartan (MICARDIS) 40 MG tablet Take 1 tablet (40 mg total) by mouth daily. Please take one tablet ( 40 mg ) total in the morning. Then take 1/2 tablet (20 mg ) total in the evening.     Allergies:   Patient has no known allergies.   Social History   Socioeconomic History   Marital status: Married    Spouse name: Not on file   Number of children: Not on file   Years of education: Not on file  Highest education level: Not on file  Occupational History   Not on file  Tobacco Use   Smoking status: Never   Smokeless tobacco: Never  Substance and Sexual Activity   Alcohol use: Yes    Comment: occassionally   Drug use: No   Sexual activity: Yes    Birth control/protection: Post-menopausal  Other Topics Concern   Not on file  Social History Narrative   Not on file   Social Determinants of Health   Financial Resource Strain: Not on file  Food Insecurity: Not on file  Transportation Needs: Not on file  Physical Activity: Not on file  Stress: Not on file  Social Connections: Not on file     Family History: The patient's family history includes Aneurysm in her father; Cancer in her father, maternal uncle, and paternal uncle; Hypercholesterolemia in her brother and  sister; Hypertension in her brother, father, and sister; Osteoporosis in her mother.  ROS:   Review of Systems  Constitution: Negative for decreased appetite, fever and weight gain.  HENT: Negative for congestion, ear discharge, hoarse voice and sore throat.   Eyes: Negative for discharge, redness, vision loss in right eye and visual halos.  Cardiovascular: Negative for chest pain, dyspnea on exertion, leg swelling, orthopnea and palpitations.  Respiratory: Negative for cough, hemoptysis, shortness of breath and snoring.   Endocrine: Negative for heat intolerance and polyphagia.  Hematologic/Lymphatic: Negative for bleeding problem. Does not bruise/bleed easily.  Skin: Negative for flushing, nail changes, rash and suspicious lesions.  Musculoskeletal: Negative for arthritis, joint pain, muscle cramps, myalgias, neck pain and stiffness.  Gastrointestinal: Negative for abdominal pain, bowel incontinence, diarrhea and excessive appetite.  Genitourinary: Negative for decreased libido, genital sores and incomplete emptying.  Neurological: Negative for brief paralysis, focal weakness, headaches and loss of balance.  Psychiatric/Behavioral: Negative for altered mental status, depression and suicidal ideas.  Allergic/Immunologic: Negative for HIV exposure and persistent infections.    EKGs/Labs/Other Studies Reviewed:    The following studies were reviewed today:   EKG:  The ekg ordered today demonstrates sinus rhythm, heart rate 75 bpm compared to prior EKG no significant change.  Recent Labs: No results found for requested labs within last 8760 hours.  Recent Lipid Panel No results found for: CHOL, TRIG, HDL, CHOLHDL, VLDL, LDLCALC, LDLDIRECT  Physical Exam:    VS:  BP (!) 142/90   Pulse 75   Ht 5\' 2"  (1.575 m)   Wt 147 lb 6.4 oz (66.9 kg)   SpO2 97%   BMI 26.96 kg/m     Wt Readings from Last 3 Encounters:  01/09/21 147 lb 6.4 oz (66.9 kg)  07/12/20 152 lb 3.2 oz (69 kg)   01/29/20 148 lb (67.1 kg)     GEN: Well nourished, well developed in no acute distress HEENT: Normal NECK: No JVD; No carotid bruits LYMPHATICS: No lymphadenopathy CARDIAC: S1S2 noted,RRR, no murmurs, rubs, gallops RESPIRATORY:  Clear to auscultation without rales, wheezing or rhonchi  ABDOMEN: Soft, non-tender, non-distended, +bowel sounds, no guarding. EXTREMITIES: No edema, No cyanosis, no clubbing MUSCULOSKELETAL:  No deformity  SKIN: Warm and dry NEUROLOGIC:  Alert and oriented x 3, non-focal PSYCHIATRIC:  Normal affect, good insight  ASSESSMENT:    1. Hypertension, unspecified type   2. OSA (obstructive sleep apnea)   3. Mixed hyperlipidemia    PLAN:     1.  We will cut back on her Micardis will not be 20 mg today.  For now we will keep her on  Toprol-XL.  If her blood pressure continues to stay low we will taper her off the beta-blocker.  2.  OSA-continue with the CPAP.  The patient is in agreement with the above plan. The patient left the office in stable condition.  The patient will follow up in 1 year or sooner if needed.   Medication Adjustments/Labs and Tests Ordered: Current medicines are reviewed at length with the patient today.  Concerns regarding medicines are outlined above.  Orders Placed This Encounter  Procedures   EKG 12-Lead   Meds ordered this encounter  Medications   telmisartan (MICARDIS) 20 MG tablet    Sig: Take 1 tablet (20 mg total) by mouth daily.    Dispense:  90 tablet    Refill:  3    Patient Instructions  Medication Instructions:  Your physician has recommended you make the following change in your medication:  DECREASE: Telmisartan (Micardis) 20 mg once daily   *If you need a refill on your cardiac medications before your next appointment, please call your pharmacy*   Lab Work: None If you have labs (blood work) drawn today and your tests are completely normal, you will receive your results only by: [place] (if you  have MyChart) OR A paper copy in the mail If you have any lab test that is abnormal or we need to change your treatment, we will call you to review the results.   Testing/Procedures: None   Follow-Up: At [facility], you and your health needs are our priority.  As part of our continuing mission to provide you with exceptional heart care, we have created designated Provider Care Teams.  These Care Teams include your primary Cardiologist (physician) and Advanced Practice Providers (APPs -  Physician Assistants and Nurse Practitioners) who all work together to provide you with the care you need, when you need it.  We recommend signing up for the patient portal called "MyChart".  Sign up information is provided on this After Visit Summary.  MyChart is used to connect with patients for Virtual Visits (Telemedicine).  Patients are able to view lab/test results, encounter notes, upcoming appointments, etc.  Non-urgent messages can be sent to your provider as well.   To learn more about what you can do with MyChart, go to [email or web address].    Your next appointment:   12 month(s)  The format for your next appointment:   In Person  Provider:   [address] - Samantha Salines, DO   Other Instructions    Adopting a Healthy Lifestyle.  Know what a healthy weight is for you (roughly BMI <25) and aim to maintain this   Aim for 7+ servings of fruits and vegetables daily   65-80+ fluid ounces of water or unsweet tea for healthy kidneys   Limit to max 1 drink of alcohol per day; avoid smoking/tobacco   Limit animal fats in diet for cholesterol and heart health - choose grass fed whenever available   Avoid highly processed foods, and foods high in saturated/trans fats   Aim for low stress - take time to unwind and care for your mental health   Aim for 150 min of moderate intensity exercise weekly for heart health, and weights twice weekly for bone health   Aim for 7-9 hours of sleep  daily   When it comes to diets, agreement about the perfect plan isnt easy to find, even among the experts. Experts at the [employer] developed an idea known as the  Healthy Eating Plate. Just imagine a plate divided into logical, healthy portions.   The emphasis is on diet quality:   Load up on vegetables and fruits - one-half of your plate: Aim for color and variety, and remember that potatoes dont count.   Go for whole grains - one-quarter of your plate: Whole wheat, barley, wheat berries, quinoa, oats, brown rice, and foods made with them. If you want pasta, go with whole wheat pasta.   Protein power - one-quarter of your plate: Fish, chicken, beans, and nuts are all healthy, versatile protein sources. Limit red meat.   The diet, however, does go beyond the plate, offering a few other suggestions.   Use healthy plant oils, such as olive, canola, soy, corn, sunflower and peanut. Check the labels, and avoid partially hydrogenated oil, which have unhealthy trans fats.   If youre thirsty, drink water. Coffee and tea are good in moderation, but skip sugary drinks and limit milk and dairy products to one or two daily servings.   The type of carbohydrate in the diet is more important than the amount. Some sources of carbohydrates, such as vegetables, fruits, whole grains, and beans-are healthier than others.   Finally, stay active  Signed, Samantha Salines, DO  01/09/2021 3:20 PM    [facility]

## 2021-01-09 NOTE — Patient Instructions (Addendum)
Medication Instructions:  Your physician has recommended you make the following change in your medication:  DECREASE: Telmisartan (Micardis) 20 mg once daily   *If you need a refill on your cardiac medications before your next appointment, please call your pharmacy*   Lab Work: None If you have labs (blood work) drawn today and your tests are completely normal, you will receive your results only by: [place] (if you have MyChart) OR A paper copy in the mail If you have any lab test that is abnormal or we need to change your treatment, we will call you to review the results.   Testing/Procedures: None   Follow-Up: At [facility], you and your health needs are our priority.  As part of our continuing mission to provide you with exceptional heart care, we have created designated Provider Care Teams.  These Care Teams include your primary Cardiologist (physician) and Advanced Practice Providers (APPs -  Physician Assistants and Nurse Practitioners) who all work together to provide you with the care you need, when you need it.  We recommend signing up for the patient portal called "MyChart".  Sign up information is provided on this After Visit Summary.  MyChart is used to connect with patients for Virtual Visits (Telemedicine).  Patients are able to view lab/test results, encounter notes, upcoming appointments, etc.  Non-urgent messages can be sent to your provider as well.   To learn more about what you can do with MyChart, go to [email or web address].    Your next appointment:   12 month(s)  The format for your next appointment:   In Person  Provider:   [address] - Berniece Salines, DO   Other Instructions

## 2021-01-24 DIAGNOSIS — G4733 Obstructive sleep apnea (adult) (pediatric): Secondary | ICD-10-CM | POA: Diagnosis not present

## 2021-01-30 DIAGNOSIS — G4733 Obstructive sleep apnea (adult) (pediatric): Secondary | ICD-10-CM | POA: Diagnosis not present

## 2021-02-08 DIAGNOSIS — G4733 Obstructive sleep apnea (adult) (pediatric): Secondary | ICD-10-CM | POA: Diagnosis not present

## 2021-03-11 DIAGNOSIS — G4733 Obstructive sleep apnea (adult) (pediatric): Secondary | ICD-10-CM | POA: Diagnosis not present

## 2021-04-02 DIAGNOSIS — H02844 Edema of left upper eyelid: Secondary | ICD-10-CM | POA: Diagnosis not present

## 2021-04-02 DIAGNOSIS — B0050 Herpesviral ocular disease, unspecified: Secondary | ICD-10-CM | POA: Diagnosis not present

## 2021-04-05 DIAGNOSIS — B0050 Herpesviral ocular disease, unspecified: Secondary | ICD-10-CM | POA: Diagnosis not present

## 2021-04-06 ENCOUNTER — Other Ambulatory Visit: Payer: Self-pay

## 2021-04-06 MED ORDER — METOPROLOL SUCCINATE ER 25 MG PO TB24: 12.5000 mg | ORAL_TABLET | Freq: Every day | ORAL | 3 refills | Status: DC

## 2021-04-06 NOTE — Telephone Encounter (Signed)
Metoprolol Succ sent as requested.

## 2021-04-09 MED ORDER — METOPROLOL SUCCINATE ER 25 MG PO TB24: 25.0000 mg | ORAL_TABLET | Freq: Every day | ORAL | 3 refills | Status: DC

## 2021-04-09 MED ORDER — TELMISARTAN 20 MG PO TABS: 20.0000 mg | ORAL_TABLET | Freq: Two times a day (BID) | ORAL | 3 refills | Status: DC

## 2021-04-09 NOTE — Addendum Note (Signed)
Addended by: Resa Miner I on: 04/09/2021 09:03 AM   Modules accepted: Orders

## 2021-04-10 ENCOUNTER — Telehealth: Payer: Self-pay

## 2021-04-10 DIAGNOSIS — G4733 Obstructive sleep apnea (adult) (pediatric): Secondary | ICD-10-CM | POA: Diagnosis not present

## 2021-04-10 NOTE — Telephone Encounter (Signed)
Per cover my meds, Telmisartan 20mg  approved for a year starting on 04/09/2021

## 2021-04-13 ENCOUNTER — Telehealth: Payer: Self-pay

## 2021-04-13 ENCOUNTER — Other Ambulatory Visit: Payer: Self-pay

## 2021-04-13 MED ORDER — TELMISARTAN 40 MG PO TABS: 40.0000 mg | ORAL_TABLET | Freq: Every day | ORAL | 3 refills | Status: DC

## 2021-04-13 NOTE — Telephone Encounter (Signed)
Prescription sent to pharmacy.

## 2021-04-13 NOTE — Progress Notes (Signed)
Prescription sent to pharmacy.

## 2021-04-13 NOTE — Telephone Encounter (Signed)
Received a fax today stating insurance would not cover the Telmisartan 20 mg BID but will cover it for 40 mg once daily.

## 2021-04-26 DIAGNOSIS — D225 Melanocytic nevi of trunk: Secondary | ICD-10-CM | POA: Diagnosis not present

## 2021-04-26 DIAGNOSIS — D2239 Melanocytic nevi of other parts of face: Secondary | ICD-10-CM | POA: Diagnosis not present

## 2021-04-26 DIAGNOSIS — D485 Neoplasm of uncertain behavior of skin: Secondary | ICD-10-CM | POA: Diagnosis not present

## 2021-04-26 DIAGNOSIS — D1801 Hemangioma of skin and subcutaneous tissue: Secondary | ICD-10-CM | POA: Diagnosis not present

## 2021-04-26 DIAGNOSIS — L814 Other melanin hyperpigmentation: Secondary | ICD-10-CM | POA: Diagnosis not present

## 2021-05-01 DIAGNOSIS — J111 Influenza due to unidentified influenza virus with other respiratory manifestations: Secondary | ICD-10-CM | POA: Diagnosis not present

## 2021-05-01 DIAGNOSIS — Z20828 Contact with and (suspected) exposure to other viral communicable diseases: Secondary | ICD-10-CM | POA: Diagnosis not present

## 2021-05-01 DIAGNOSIS — R051 Acute cough: Secondary | ICD-10-CM | POA: Diagnosis not present

## 2021-05-02 DIAGNOSIS — G4733 Obstructive sleep apnea (adult) (pediatric): Secondary | ICD-10-CM | POA: Diagnosis not present

## 2021-05-11 DIAGNOSIS — G4733 Obstructive sleep apnea (adult) (pediatric): Secondary | ICD-10-CM | POA: Diagnosis not present

## 2021-07-13 ENCOUNTER — Encounter: Payer: Self-pay | Admitting: Cardiology

## 2021-07-13 MED ORDER — TELMISARTAN 40 MG PO TABS: 40.0000 mg | ORAL_TABLET | Freq: Every day | ORAL | 1 refills | Status: DC

## 2021-07-13 MED ORDER — METOPROLOL SUCCINATE ER 25 MG PO TB24: 25.0000 mg | ORAL_TABLET | Freq: Every day | ORAL | 1 refills | Status: DC

## 2021-08-09 DIAGNOSIS — G4733 Obstructive sleep apnea (adult) (pediatric): Secondary | ICD-10-CM | POA: Diagnosis not present

## 2021-08-09 DIAGNOSIS — Z79899 Other long term (current) drug therapy: Secondary | ICD-10-CM | POA: Diagnosis not present

## 2021-08-09 DIAGNOSIS — E039 Hypothyroidism, unspecified: Secondary | ICD-10-CM | POA: Diagnosis not present

## 2021-08-09 DIAGNOSIS — I10 Essential (primary) hypertension: Secondary | ICD-10-CM | POA: Diagnosis not present

## 2021-08-09 DIAGNOSIS — E559 Vitamin D deficiency, unspecified: Secondary | ICD-10-CM | POA: Diagnosis not present

## 2021-08-09 DIAGNOSIS — E7800 Pure hypercholesterolemia, unspecified: Secondary | ICD-10-CM | POA: Diagnosis not present

## 2021-08-09 DIAGNOSIS — Z23 Encounter for immunization: Secondary | ICD-10-CM | POA: Diagnosis not present

## 2021-08-09 DIAGNOSIS — L309 Dermatitis, unspecified: Secondary | ICD-10-CM | POA: Diagnosis not present

## 2021-08-09 DIAGNOSIS — Z0000 Encounter for general adult medical examination without abnormal findings: Secondary | ICD-10-CM | POA: Diagnosis not present

## 2021-08-27 DIAGNOSIS — Z6827 Body mass index (BMI) 27.0-27.9, adult: Secondary | ICD-10-CM | POA: Diagnosis not present

## 2021-08-27 DIAGNOSIS — H6121 Impacted cerumen, right ear: Secondary | ICD-10-CM | POA: Diagnosis not present

## 2021-08-27 DIAGNOSIS — H938X1 Other specified disorders of right ear: Secondary | ICD-10-CM | POA: Diagnosis not present

## 2021-09-10 DIAGNOSIS — Z6827 Body mass index (BMI) 27.0-27.9, adult: Secondary | ICD-10-CM | POA: Diagnosis not present

## 2021-09-10 DIAGNOSIS — Z1231 Encounter for screening mammogram for malignant neoplasm of breast: Secondary | ICD-10-CM | POA: Diagnosis not present

## 2021-09-10 DIAGNOSIS — Z8481 Family history of carrier of genetic disease: Secondary | ICD-10-CM | POA: Diagnosis not present

## 2021-09-10 DIAGNOSIS — M8580 Other specified disorders of bone density and structure, unspecified site: Secondary | ICD-10-CM | POA: Diagnosis not present

## 2021-09-10 DIAGNOSIS — Z01419 Encounter for gynecological examination (general) (routine) without abnormal findings: Secondary | ICD-10-CM | POA: Diagnosis not present

## 2021-09-10 DIAGNOSIS — Z1211 Encounter for screening for malignant neoplasm of colon: Secondary | ICD-10-CM | POA: Diagnosis not present

## 2021-09-10 DIAGNOSIS — I10 Essential (primary) hypertension: Secondary | ICD-10-CM | POA: Diagnosis not present

## 2021-09-13 ENCOUNTER — Other Ambulatory Visit: Payer: Self-pay | Admitting: Obstetrics and Gynecology

## 2021-09-13 DIAGNOSIS — M8580 Other specified disorders of bone density and structure, unspecified site: Secondary | ICD-10-CM

## 2021-09-24 DIAGNOSIS — Z780 Asymptomatic menopausal state: Secondary | ICD-10-CM | POA: Diagnosis not present

## 2021-09-24 DIAGNOSIS — M810 Age-related osteoporosis without current pathological fracture: Secondary | ICD-10-CM | POA: Diagnosis not present

## 2021-09-24 DIAGNOSIS — M85852 Other specified disorders of bone density and structure, left thigh: Secondary | ICD-10-CM | POA: Diagnosis not present

## 2021-09-24 DIAGNOSIS — M85851 Other specified disorders of bone density and structure, right thigh: Secondary | ICD-10-CM | POA: Diagnosis not present

## 2021-09-26 DIAGNOSIS — I10 Essential (primary) hypertension: Secondary | ICD-10-CM | POA: Diagnosis not present

## 2021-10-03 DIAGNOSIS — M810 Age-related osteoporosis without current pathological fracture: Secondary | ICD-10-CM | POA: Diagnosis not present

## 2021-11-15 DIAGNOSIS — D2239 Melanocytic nevi of other parts of face: Secondary | ICD-10-CM | POA: Diagnosis not present

## 2021-11-15 DIAGNOSIS — L820 Inflamed seborrheic keratosis: Secondary | ICD-10-CM | POA: Diagnosis not present

## 2022-01-07 ENCOUNTER — Other Ambulatory Visit: Payer: Self-pay | Admitting: Cardiology

## 2022-01-15 DIAGNOSIS — H43811 Vitreous degeneration, right eye: Secondary | ICD-10-CM | POA: Diagnosis not present

## 2022-02-08 ENCOUNTER — Other Ambulatory Visit: Payer: Self-pay

## 2022-02-08 ENCOUNTER — Encounter: Payer: Self-pay | Admitting: Cardiology

## 2022-02-08 ENCOUNTER — Ambulatory Visit: Payer: Medicare PPO | Admitting: Cardiology

## 2022-02-08 VITALS — BP 136/100 | HR 54 | Ht 61.0 in | Wt 149.6 lb

## 2022-02-08 DIAGNOSIS — I10 Essential (primary) hypertension: Secondary | ICD-10-CM

## 2022-02-08 DIAGNOSIS — E782 Mixed hyperlipidemia: Secondary | ICD-10-CM | POA: Diagnosis not present

## 2022-02-08 MED ORDER — TELMISARTAN 40 MG PO TABS: 40.0000 mg | ORAL_TABLET | Freq: Every day | ORAL | 3 refills | Status: DC

## 2022-02-08 MED ORDER — AMLODIPINE BESYLATE 2.5 MG PO TABS: 2.5000 mg | ORAL_TABLET | Freq: Every morning | ORAL | 3 refills | Status: DC

## 2022-02-08 MED ORDER — METOPROLOL SUCCINATE ER 25 MG PO TB24: 25.0000 mg | ORAL_TABLET | Freq: Every day | ORAL | 3 refills | Status: DC

## 2022-02-08 NOTE — Progress Notes (Signed)
Cardiology Office Note:    Date:  02/08/2022   ID:  Samantha Anderson, DOB [birth year], MRN [id number]  PCP:  Mateo Flow, MD  Cardiologist:  Berniece Salines, DO  Electrophysiologist:  None   Referring MD: Mateo Flow, MD   " I am doing doing well"   History of Present Illness:    Samantha Anderson is a 71 y.o. female with a hx of  hypertension, hyperlipidemia, hypothyroidism, severe obstructive sleep apnea now on CPAP. Here today for follow-up visit.  I initially on August 18, 2019 to be evaluated for chest pain.  At her visit the patient blood pressure was elevated at that time she was only on metoprolol succinate 25 mg daily.  She has been on telmisartan-hydrochlorothiazide which the patient reported that made her hypotensive.  Given her elevated blood pressure I restarted the patient on telmisartan 40 mg daily.  Therefore her regimen was now metoprolol succinate 25 mg daily and telmisartan 40 mg daily.   I did see the patient on 08/30/2019 at that time she complained of chest pain therefore I recommended she undergo a CCTA.   Since I saw the she tells me she has been having a difficult time, her daughter is going through a divorce and she is really having a hard time with this.  Her blood pressure has been fluctuating.   Past Medical History:  Diagnosis Date   Benign essential hypertension    Cancer ([place]) 2006   melanoma   Chest pain    Elevated LDL cholesterol level    Hypothyroidism    Thyroid disease    hyperthyroidism   Vitamin D deficiency     Past Surgical History:  Procedure Laterality Date   CESAREAN SECTION      Current Medications: Current Meds  Medication Sig   amLODipine (NORVASC) 2.5 MG tablet Take 1 tablet (2.5 mg total) by mouth in the morning.   Ascorbic Acid (VITAMIN C) 500 MG CAPS Take 500 mg by mouth daily.   Cholecalciferol (VITAMIN D3) 125 MCG (5000 UT) CAPS Take 10,000 Units by mouth daily.   MAGNESIUM GLYCINATE PO Take 120 mg by mouth daily.    metoprolol succinate (TOPROL-XL) 25 MG 24 hr tablet TAKE 1 TABLET BY MOUTH ONCE DAILY.   SYNTHROID 100 MCG tablet 100 mcg daily.   telmisartan (MICARDIS) 40 MG tablet TAKE 1 TABLET BY MOUTH ONCE DAILY.   vitamin k 100 MCG tablet Take 100 mcg by mouth daily.   Zinc Sulfate (ZINC 15 PO) Take 15 mg by mouth daily.     Allergies:   Patient has no known allergies.   Social History   Socioeconomic History   Marital status: Married    Spouse name: Not on file   Number of children: Not on file   Years of education: Not on file   Highest education level: Not on file  Occupational History   Not on file  Tobacco Use   Smoking status: Never   Smokeless tobacco: Never  Substance and Sexual Activity   Alcohol use: Yes    Comment: occassionally   Drug use: No   Sexual activity: Yes    Birth control/protection: Post-menopausal  Other Topics Concern   Not on file  Social History Narrative   Not on file   Social Determinants of Health   Financial Resource Strain: Not on file  Food Insecurity: Not on file  Transportation Needs: Not on file  Physical Activity: Not on file  Stress:  Not on file  Social Connections: Not on file     Family History: The patient's family history includes Aneurysm in her father; Cancer in her father, maternal uncle, and paternal uncle; Hypercholesterolemia in her brother and sister; Hypertension in her brother, father, and sister; Osteoporosis in her mother.  ROS:   Review of Systems  Constitution: Negative for decreased appetite, fever and weight gain.  HENT: Negative for congestion, ear discharge, hoarse voice and sore throat.   Eyes: Negative for discharge, redness, vision loss in right eye and visual halos.  Cardiovascular: Negative for chest pain, dyspnea on exertion, leg swelling, orthopnea and palpitations.  Respiratory: Negative for cough, hemoptysis, shortness of breath and snoring.   Endocrine: Negative for heat intolerance and polyphagia.   Hematologic/Lymphatic: Negative for bleeding problem. Does not bruise/bleed easily.  Skin: Negative for flushing, nail changes, rash and suspicious lesions.  Musculoskeletal: Negative for arthritis, joint pain, muscle cramps, myalgias, neck pain and stiffness.  Gastrointestinal: Negative for abdominal pain, bowel incontinence, diarrhea and excessive appetite.  Genitourinary: Negative for decreased libido, genital sores and incomplete emptying.  Neurological: Negative for brief paralysis, focal weakness, headaches and loss of balance.  Psychiatric/Behavioral: Negative for altered mental status, depression and suicidal ideas.  Allergic/Immunologic: Negative for HIV exposure and persistent infections.    EKGs/Labs/Other Studies Reviewed:    The following studies were reviewed today:   EKG:  The ekg ordered today demonstrates     TTE 09/29/2021 IMPRESSIONS     1. Left ventricular ejection fraction, by estimation, is 60 to 65%. The  left ventricle has normal function. The left ventricle has no regional  wall motion abnormalities. There is mild concentric left ventricular  hypertrophy. Left ventricular diastolic  parameters are consistent with Grade I diastolic dysfunction (impaired  relaxation).   2. Right ventricular systolic function is normal. The right ventricular  size is normal. There is normal pulmonary artery systolic pressure.   3. The mitral valve is normal in structure. Mild mitral valve  regurgitation. No evidence of mitral stenosis.   4. The aortic valve is tricuspid. Aortic valve regurgitation is not  visualized. No aortic stenosis is present.   5. The inferior vena cava is normal in size with greater than 50%  respiratory variability, suggesting right atrial pressure of 3 mmHg.   FINDINGS   Left Ventricle: Left ventricular ejection fraction, by estimation, is 60  to 65%. The left ventricle has normal function. The left ventricle has no  regional wall motion  abnormalities. The left ventricular internal cavity  size was normal in size. There is   mild concentric left ventricular hypertrophy. Left ventricular diastolic  parameters are consistent with Grade I diastolic dysfunction (impaired  relaxation). Normal left ventricular filling pressure.   Right Ventricle: The right ventricular size is normal. No increase in  right ventricular wall thickness. Right ventricular systolic function is  normal. There is normal pulmonary artery systolic pressure. The tricuspid  regurgitant velocity is 2.54 m/s, and   with an assumed right atrial pressure of 3 mmHg, the estimated right  ventricular systolic pressure is 62.3 mmHg.   Left Atrium: Left atrial size was normal in size.   Right Atrium: Right atrial size was normal in size.   Pericardium: There is no evidence of pericardial effusion.   Mitral Valve: The mitral valve is normal in structure. Normal mobility of  the mitral valve leaflets. Mild mitral valve regurgitation. No evidence of  mitral valve stenosis.   Tricuspid Valve: The tricuspid valve  is normal in structure. Tricuspid  valve regurgitation is mild . No evidence of tricuspid stenosis.   Aortic Valve: The aortic valve is tricuspid. Aortic valve regurgitation is  not visualized. No aortic stenosis is present.   Pulmonic Valve: The pulmonic valve was normal in structure. Pulmonic valve  regurgitation is not visualized. No evidence of pulmonic stenosis.   Aorta: The aortic root and ascending aorta are structurally normal, with  no evidence of dilitation.   Venous: A normal flow pattern is recorded from the right upper pulmonary  vein. The inferior vena cava is normal in size with greater than 50%  respiratory variability, suggesting right atrial pressure of 3 mmHg.   IAS/Shunts: No atrial level shunt detected by color flow Doppler.         CCTA  Aorta: Normal size.  No calcifications.  No dissection.   Aortic Valve:   Trileaflet.  No calcifications.   Coronary Arteries:  Normal coronary origin.  Right dominance.   RCA is a large dominant artery that gives rise to PDA and PLVB. There is no plaque.   Left main is a large artery that gives rise to LAD and LCX arteries.   LAD is a large vessel that has no plaque.   LCX is a non-dominant artery that gives rise to one large OM1 branch. There is no plaque.   Other findings:   Normal pulmonary vein drainage into the left atrium.   Normal left atrial appendage without a thrombus.   Normal size of the pulmonary artery.   IMPRESSION: 1. Coronary calcium score of 0. This was 0 percentile for age and sex matched control.   2. Normal coronary origin with right dominance.   3. No evidence of CAD.   Berniece Salines, DO     Electronically Signed   By: Berniece Salines MD   On: 09/30/2019 16:46  Recent Labs: No results found for requested labs within last 365 days.  Recent Lipid Panel No results found for: "CHOL", "TRIG", "HDL", "CHOLHDL", "VLDL", "LDLCALC", "LDLDIRECT"  Physical Exam:    VS:  BP (!) 136/100   Pulse (!) 54   Ht '5\' 1"'$  (1.549 m)   Wt 149 lb 9.6 oz (67.9 kg)   SpO2 98%   BMI 28.27 kg/m     Wt Readings from Last 3 Encounters:  02/08/22 149 lb 9.6 oz (67.9 kg)  01/09/21 147 lb 6.4 oz (66.9 kg)  07/12/20 152 lb 3.2 oz (69 kg)     GEN: Well nourished, well developed in no acute distress HEENT: Normal NECK: No JVD; No carotid bruits LYMPHATICS: No lymphadenopathy CARDIAC: S1S2 noted,RRR, no murmurs, rubs, gallops RESPIRATORY:  Clear to auscultation without rales, wheezing or rhonchi  ABDOMEN: Soft, non-tender, non-distended, +bowel sounds, no guarding. EXTREMITIES: No edema, No cyanosis, no clubbing MUSCULOSKELETAL:  No deformity  SKIN: Warm and dry NEUROLOGIC:  Alert and oriented x 3, non-focal PSYCHIATRIC:  Normal affect, good insight  ASSESSMENT:    1. Hypertension, unspecified type   2. Mixed hyperlipidemia    PLAN:      Her blood pressure is elevated in the office today.  This was taken manually by me as well.  What I like to do is amlodipine 2.5 mg daily to her current regimen.  She takes telmisartan 20 mg twice a day.  As well as metoprolol succinate 25 mg daily.   The patient is in agreement with the above plan. The patient left the office in stable condition.  The  patient will follow up in   Medication Adjustments/Labs and Tests Ordered: Current medicines are reviewed at length with the patient today.  Concerns regarding medicines are outlined above.  Orders Placed This Encounter  Procedures   EKG 12-Lead   Meds ordered this encounter  Medications   amLODipine (NORVASC) 2.5 MG tablet    Sig: Take 1 tablet (2.5 mg total) by mouth in the morning.    Dispense:  90 tablet    Refill:  3    Patient Instructions  Medication Instructions:  Your physician has recommended you make the following change in your medication:  START: Amlodipine 2.5 mg once daily in the morning  Please take your blood pressure daily for 2 weeks and send in a MyChart message. Please include heart rates.   HOW TO TAKE YOUR BLOOD PRESSURE: Rest 5 minutes before taking your blood pressure. Don't smoke or drink caffeinated beverages for at least 30 minutes before. Take your blood pressure before (not after) you eat. Sit comfortably with your back supported and both feet on the floor (don't cross your legs). Elevate your arm to heart level on a table or a desk. Use the proper sized cuff. It should fit smoothly and snugly around your bare upper arm. There should be enough room to slip a fingertip under the cuff. The bottom edge of the cuff should be 1 inch above the crease of the elbow. Ideally, take 3 measurements at one sitting and record the average.  *If you need a refill on your cardiac medications before your next appointment, please call your pharmacy*   Lab Work: None If you have labs (blood work) drawn today and  your tests are completely normal, you will receive your results only by: [place] (if you have MyChart) OR A paper copy in the mail If you have any lab test that is abnormal or we need to change your treatment, we will call you to review the results.   Testing/Procedures: None   Follow-Up: At [facility], you and your health needs are our priority.  As part of our continuing mission to provide you with exceptional heart care, we have created designated Provider Care Teams.  These Care Teams include your primary Cardiologist (physician) and Advanced Practice Providers (APPs -  Physician Assistants and Nurse Practitioners) who all work together to provide you with the care you need, when you need it.  We recommend signing up for the patient portal called "MyChart".  Sign up information is provided on this After Visit Summary.  MyChart is used to connect with patients for Virtual Visits (Telemedicine).  Patients are able to view lab/test results, encounter notes, upcoming appointments, etc.  Non-urgent messages can be sent to your provider as well.   To learn more about what you can do with MyChart, go to [email or web address].    Your next appointment:   1 year(s)  The format for your next appointment:   In Person  Provider:   Berniece Salines, DO     Other Instructions   Important Information About Sugar         Adopting a Healthy Lifestyle.  Know what a healthy weight is for you (roughly BMI <25) and aim to maintain this   Aim for 7+ servings of fruits and vegetables daily   65-80+ fluid ounces of water or unsweet tea for healthy kidneys   Limit to max 1 drink of alcohol per day; avoid smoking/tobacco   Limit animal fats in diet for cholesterol  and heart health - choose grass fed whenever available   Avoid highly processed foods, and foods high in saturated/trans fats   Aim for low stress - take time to unwind and care for your mental health   Aim for 150  min of moderate intensity exercise weekly for heart health, and weights twice weekly for bone health   Aim for 7-9 hours of sleep daily   When it comes to diets, agreement about the perfect plan isnt easy to find, even among the experts. Experts at the [employer] developed an idea known as the Healthy Eating Plate. Just imagine a plate divided into logical, healthy portions.   The emphasis is on diet quality:   Load up on vegetables and fruits - one-half of your plate: Aim for color and variety, and remember that potatoes dont count.   Go for whole grains - one-quarter of your plate: Whole wheat, barley, wheat berries, quinoa, oats, brown rice, and foods made with them. If you want pasta, go with whole wheat pasta.   Protein power - one-quarter of your plate: Fish, chicken, beans, and nuts are all healthy, versatile protein sources. Limit red meat.   The diet, however, does go beyond the plate, offering a few other suggestions.   Use healthy plant oils, such as olive, canola, soy, corn, sunflower and peanut. Check the labels, and avoid partially hydrogenated oil, which have unhealthy trans fats.   If youre thirsty, drink water. Coffee and tea are good in moderation, but skip sugary drinks and limit milk and dairy products to one or two daily servings.   The type of carbohydrate in the diet is more important than the amount. Some sources of carbohydrates, such as vegetables, fruits, whole grains, and beans-are healthier than others.   Finally, stay active  Signed, Berniece Salines, DO  02/08/2022 12:06 PM    [facility]

## 2022-02-08 NOTE — Patient Instructions (Addendum)
Medication Instructions:  Your physician has recommended you make the following change in your medication:  START: Amlodipine 2.5 mg once daily in the morning  Please take your blood pressure daily for 2 weeks and send in a MyChart message. Please include heart rates.   HOW TO TAKE YOUR BLOOD PRESSURE: Rest 5 minutes before taking your blood pressure. Don't smoke or drink caffeinated beverages for at least 30 minutes before. Take your blood pressure before (not after) you eat. Sit comfortably with your back supported and both feet on the floor (don't cross your legs). Elevate your arm to heart level on a table or a desk. Use the proper sized cuff. It should fit smoothly and snugly around your bare upper arm. There should be enough room to slip a fingertip under the cuff. The bottom edge of the cuff should be 1 inch above the crease of the elbow. Ideally, take 3 measurements at one sitting and record the average.  *If you need a refill on your cardiac medications before your next appointment, please call your pharmacy*   Lab Work: None If you have labs (blood work) drawn today and your tests are completely normal, you will receive your results only by: [place] (if you have MyChart) OR A paper copy in the mail If you have any lab test that is abnormal or we need to change your treatment, we will call you to review the results.   Testing/Procedures: None   Follow-Up: At [facility], you and your health needs are our priority.  As part of our continuing mission to provide you with exceptional heart care, we have created designated Provider Care Teams.  These Care Teams include your primary Cardiologist (physician) and Advanced Practice Providers (APPs -  Physician Assistants and Nurse Practitioners) who all work together to provide you with the care you need, when you need it.  We recommend signing up for the patient portal called "MyChart".  Sign up information is provided on  this After Visit Summary.  MyChart is used to connect with patients for Virtual Visits (Telemedicine).  Patients are able to view lab/test results, encounter notes, upcoming appointments, etc.  Non-urgent messages can be sent to your provider as well.   To learn more about what you can do with MyChart, go to [email or web address].    Your next appointment:   1 year(s)  The format for your next appointment:   In Person  Provider:   Berniece Salines, DO     Other Instructions   Important Information About Sugar

## 2022-02-21 DIAGNOSIS — H01009 Unspecified blepharitis unspecified eye, unspecified eyelid: Secondary | ICD-10-CM | POA: Diagnosis not present

## 2022-02-21 DIAGNOSIS — H43811 Vitreous degeneration, right eye: Secondary | ICD-10-CM | POA: Diagnosis not present

## 2022-02-21 DIAGNOSIS — H43391 Other vitreous opacities, right eye: Secondary | ICD-10-CM | POA: Diagnosis not present

## 2022-02-21 DIAGNOSIS — H25813 Combined forms of age-related cataract, bilateral: Secondary | ICD-10-CM | POA: Diagnosis not present

## 2022-02-21 DIAGNOSIS — H04123 Dry eye syndrome of bilateral lacrimal glands: Secondary | ICD-10-CM | POA: Diagnosis not present

## 2022-03-05 ENCOUNTER — Encounter: Payer: Self-pay | Admitting: Cardiology

## 2022-03-05 MED ORDER — AMLODIPINE BESYLATE 5 MG PO TABS: 5.0000 mg | ORAL_TABLET | Freq: Every morning | ORAL | 3 refills | Status: DC

## 2022-03-25 DIAGNOSIS — H60331 Swimmer's ear, right ear: Secondary | ICD-10-CM | POA: Diagnosis not present

## 2022-03-28 MED ORDER — AMLODIPINE BESYLATE 5 MG PO TABS: 5.0000 mg | ORAL_TABLET | Freq: Every morning | ORAL | 3 refills | Status: DC

## 2022-03-28 NOTE — Addendum Note (Signed)
Addended by: Cristopher Estimable on: 03/28/2022 04:58 PM   Modules accepted: Orders

## 2022-05-07 DIAGNOSIS — D225 Melanocytic nevi of trunk: Secondary | ICD-10-CM | POA: Diagnosis not present

## 2022-05-07 DIAGNOSIS — D2239 Melanocytic nevi of other parts of face: Secondary | ICD-10-CM | POA: Diagnosis not present

## 2022-05-07 DIAGNOSIS — L820 Inflamed seborrheic keratosis: Secondary | ICD-10-CM | POA: Diagnosis not present

## 2022-05-07 DIAGNOSIS — L821 Other seborrheic keratosis: Secondary | ICD-10-CM | POA: Diagnosis not present

## 2022-05-07 DIAGNOSIS — L814 Other melanin hyperpigmentation: Secondary | ICD-10-CM | POA: Diagnosis not present

## 2022-05-27 ENCOUNTER — Ambulatory Visit: Payer: Medicare PPO | Attending: Cardiology | Admitting: Cardiology

## 2022-05-27 ENCOUNTER — Encounter: Payer: Self-pay | Admitting: Cardiology

## 2022-05-27 VITALS — BP 173/92 | HR 53 | Ht 61.0 in | Wt 151.8 lb

## 2022-05-27 DIAGNOSIS — E782 Mixed hyperlipidemia: Secondary | ICD-10-CM

## 2022-05-27 DIAGNOSIS — I10 Essential (primary) hypertension: Secondary | ICD-10-CM | POA: Diagnosis not present

## 2022-05-27 NOTE — Progress Notes (Signed)
Cardiology Office Note:    Date:  05/27/2022   ID:  Samantha Anderson, DOB [birth year], MRN [id number]  PCP:  Mateo Flow, MD  Cardiologist:  Berniece Salines, DO  Electrophysiologist:  None   Referring MD: Mateo Flow, MD   " I am doing doing well"   History of Present Illness:    Samantha Anderson is a 71 y.o. female with a hx of  hypertension, hyperlipidemia, hypothyroidism, severe obstructive sleep apnea now on CPAP. Here today for follow-up visit.  I initially on August 18, 2019 to be evaluated for chest pain.  At her visit the patient blood pressure was elevated at that time she was only on metoprolol succinate 25 mg daily.  She has been on telmisartan-hydrochlorothiazide which the patient reported that made her hypotensive.  Given her elevated blood pressure I restarted the patient on telmisartan 40 mg daily.  Therefore her regimen was now metoprolol succinate 25 mg daily and telmisartan 40 mg daily.   I did see the patient on 08/30/2019 at that time she complained of chest pain therefore I recommended she undergo a CCTA.   At her visit on February 08, 2022 took her blood pressure manually.  I added amlodipine 2.5 to her regimen.  She was still on the telmisartan as well has the metoprolol succinate 25 mg daily.   Past Medical History:  Diagnosis Date   Benign essential hypertension    Cancer ([place]) 2006   melanoma   Chest pain    Elevated LDL cholesterol level    Hypothyroidism    Thyroid disease    hyperthyroidism   Vitamin D deficiency     Past Surgical History:  Procedure Laterality Date   CESAREAN SECTION      Current Medications: Current Meds  Medication Sig   amLODipine (NORVASC) 5 MG tablet Take 1 tablet (5 mg total) by mouth in the morning.   Ascorbic Acid (VITAMIN C) 500 MG CAPS Take 500 mg by mouth daily.   aspirin EC 81 MG tablet Take 81 mg by mouth daily.   Cholecalciferol (VITAMIN D3) 125 MCG (5000 UT) CAPS Take 10,000 Units by mouth daily.   MAGNESIUM  GLYCINATE PO Take 120 mg by mouth daily.   metoprolol succinate (TOPROL-XL) 25 MG 24 hr tablet Take 1 tablet (25 mg total) by mouth daily.   SYNTHROID 100 MCG tablet 100 mcg daily.   telmisartan (MICARDIS) 40 MG tablet Take 1 tablet (40 mg total) by mouth daily.   vitamin k 100 MCG tablet Take 100 mcg by mouth daily.   Zinc Sulfate (ZINC 15 PO) Take 15 mg by mouth daily.     Allergies:   Patient has no known allergies.   Social History   Socioeconomic History   Marital status: Married    Spouse name: Not on file   Number of children: Not on file   Years of education: Not on file   Highest education level: Not on file  Occupational History   Not on file  Tobacco Use   Smoking status: Never   Smokeless tobacco: Never  Substance and Sexual Activity   Alcohol use: Yes    Comment: occassionally   Drug use: No   Sexual activity: Yes    Birth control/protection: Post-menopausal  Other Topics Concern   Not on file  Social History Narrative   Not on file   Social Determinants of Health   Financial Resource Strain: Not on file  Food Insecurity: Not on  file  Transportation Needs: Not on file  Physical Activity: Not on file  Stress: Not on file  Social Connections: Not on file     Family History: The patient's family history includes Aneurysm in her father; Cancer in her father, maternal uncle, and paternal uncle; Hypercholesterolemia in her brother and sister; Hypertension in her brother, father, and sister; Osteoporosis in her mother.  ROS:   Review of Systems  Constitution: Negative for decreased appetite, fever and weight gain.  HENT: Negative for congestion, ear discharge, hoarse voice and sore throat.   Eyes: Negative for discharge, redness, vision loss in right eye and visual halos.  Cardiovascular: Negative for chest pain, dyspnea on exertion, leg swelling, orthopnea and palpitations.  Respiratory: Negative for cough, hemoptysis, shortness of breath and snoring.    Endocrine: Negative for heat intolerance and polyphagia.  Hematologic/Lymphatic: Negative for bleeding problem. Does not bruise/bleed easily.  Skin: Negative for flushing, nail changes, rash and suspicious lesions.  Musculoskeletal: Negative for arthritis, joint pain, muscle cramps, myalgias, neck pain and stiffness.  Gastrointestinal: Negative for abdominal pain, bowel incontinence, diarrhea and excessive appetite.  Genitourinary: Negative for decreased libido, genital sores and incomplete emptying.  Neurological: Negative for brief paralysis, focal weakness, headaches and loss of balance.  Psychiatric/Behavioral: Negative for altered mental status, depression and suicidal ideas.  Allergic/Immunologic: Negative for HIV exposure and persistent infections.    EKGs/Labs/Other Studies Reviewed:    The following studies were reviewed today:   EKG:  The ekg ordered today demonstrates    TTE 09/29/2021 IMPRESSIONS   1. Left ventricular ejection fraction, by estimation, is 60 to 65%. The  left ventricle has normal function. The left ventricle has no regional  wall motion abnormalities. There is mild concentric left ventricular  hypertrophy. Left ventricular diastolic  parameters are consistent with Grade I diastolic dysfunction (impaired  relaxation).   2. Right ventricular systolic function is normal. The right ventricular  size is normal. There is normal pulmonary artery systolic pressure.   3. The mitral valve is normal in structure. Mild mitral valve  regurgitation. No evidence of mitral stenosis.   4. The aortic valve is tricuspid. Aortic valve regurgitation is not  visualized. No aortic stenosis is present.   5. The inferior vena cava is normal in size with greater than 50%  respiratory variability, suggesting right atrial pressure of 3 mmHg.   FINDINGS   Left Ventricle: Left ventricular ejection fraction, by estimation, is 60  to 65%. The left ventricle has normal function. The  left ventricle has no  regional wall motion abnormalities. The left ventricular internal cavity  size was normal in size. There is   mild concentric left ventricular hypertrophy. Left ventricular diastolic  parameters are consistent with Grade I diastolic dysfunction (impaired  relaxation). Normal left ventricular filling pressure.   Right Ventricle: The right ventricular size is normal. No increase in  right ventricular wall thickness. Right ventricular systolic function is  normal. There is normal pulmonary artery systolic pressure. The tricuspid  regurgitant velocity is 2.54 m/s, and   with an assumed right atrial pressure of 3 mmHg, the estimated right  ventricular systolic pressure is 01.0 mmHg.   Left Atrium: Left atrial size was normal in size.   Right Atrium: Right atrial size was normal in size.   Pericardium: There is no evidence of pericardial effusion.   Mitral Valve: The mitral valve is normal in structure. Normal mobility of  the mitral valve leaflets. Mild mitral valve regurgitation. No evidence  of  mitral valve stenosis.   Tricuspid Valve: The tricuspid valve is normal in structure. Tricuspid  valve regurgitation is mild . No evidence of tricuspid stenosis.   Aortic Valve: The aortic valve is tricuspid. Aortic valve regurgitation is  not visualized. No aortic stenosis is present.   Pulmonic Valve: The pulmonic valve was normal in structure. Pulmonic valve  regurgitation is not visualized. No evidence of pulmonic stenosis.   Aorta: The aortic root and ascending aorta are structurally normal, with  no evidence of dilitation.   Venous: A normal flow pattern is recorded from the right upper pulmonary  vein. The inferior vena cava is normal in size with greater than 50%  respiratory variability, suggesting right atrial pressure of 3 mmHg.   IAS/Shunts: No atrial level shunt detected by color flow Doppler.       CCTA  Aorta: Normal size.  No calcifications.  No  dissection.   Aortic Valve:  Trileaflet.  No calcifications.   Coronary Arteries:  Normal coronary origin.  Right dominance.   RCA is a large dominant artery that gives rise to PDA and PLVB. There is no plaque.   Left main is a large artery that gives rise to LAD and LCX arteries.   LAD is a large vessel that has no plaque.   LCX is a non-dominant artery that gives rise to one large OM1 branch. There is no plaque.   Other findings:   Normal pulmonary vein drainage into the left atrium.   Normal left atrial appendage without a thrombus.   Normal size of the pulmonary artery.   IMPRESSION: 1. Coronary calcium score of 0. This was 0 percentile for age and sex matched control.   2. Normal coronary origin with right dominance.   3. No evidence of CAD.   Berniece Salines, DO     Electronically Signed   By: Berniece Salines MD   On: 09/30/2019 16:46  Recent Labs: No results found for requested labs within last 365 days.  Recent Lipid Panel No results found for: "CHOL", "TRIG", "HDL", "CHOLHDL", "VLDL", "LDLCALC", "LDLDIRECT"  Physical Exam:    VS:  BP (!) 173/92 (BP Location: Right Arm, Patient Position: Sitting)   Pulse (!) 53   Ht '5\' 1"'$  (1.549 m)   Wt 151 lb 12.8 oz (68.9 kg)   SpO2 96%   BMI 28.68 kg/m     Wt Readings from Last 3 Encounters:  05/27/22 151 lb 12.8 oz (68.9 kg)  02/08/22 149 lb 9.6 oz (67.9 kg)  01/09/21 147 lb 6.4 oz (66.9 kg)     GEN: Well nourished, well developed in no acute distress HEENT: Normal NECK: No JVD; No carotid bruits LYMPHATICS: No lymphadenopathy CARDIAC: S1S2 noted,RRR, no murmurs, rubs, gallops RESPIRATORY:  Clear to auscultation without rales, wheezing or rhonchi  ABDOMEN: Soft, non-tender, non-distended, +bowel sounds, no guarding. EXTREMITIES: No edema, No cyanosis, no clubbing MUSCULOSKELETAL:  No deformity  SKIN: Warm and dry NEUROLOGIC:  Alert and oriented x 3, non-focal PSYCHIATRIC:  Normal affect, good  insight  ASSESSMENT:    1. Essential hypertension   2. Mixed hyperlipidemia     PLAN:     Her blood pressure is elevated in the office today.-Which was taken manually as well as her home blood pressure cuff.  Now the problem is all of her blood pressures from home are within the 130s over 80s.  I suspect that she may have whitecoat syndrome as well.  I would at this time  prefer not to increase her antihypertensive medication to make sure that we avoid any type of medication induced hypotension. In the meantime she will take her blood pressure daily and send me this information after 2 weeks.  The patient is in agreement with the above plan. The patient left the office in stable condition.  The patient will follow up in 9 months   Medication Adjustments/Labs and Tests Ordered: Current medicines are reviewed at length with the patient today.  Concerns regarding medicines are outlined above.  No orders of the defined types were placed in this encounter.  No orders of the defined types were placed in this encounter.   Patient Instructions   Medication Instructions:  Your physician recommends that you continue on your current medications as directed. Please refer to the Current Medication list given to you today.  *If you need a refill on your cardiac medications before your next appointment, please call your pharmacy*   Lab Work: NONE If you have labs (blood work) drawn today and your tests are completely normal, you will receive your results only by: [place] (if you have MyChart) OR A paper copy in the mail If you have any lab test that is abnormal or we need to change your treatment, we will call you to review the results.   Testing/Procedures: NONE   Follow-Up: At [facility], you and your health needs are our priority.  As part of our continuing mission to provide you with exceptional heart care, we have created designated Provider Care Teams.  These Care  Teams include your primary Cardiologist (physician) and Advanced Practice Providers (APPs -  Physician Assistants and Nurse Practitioners) who all work together to provide you with the care you need, when you need it.  We recommend signing up for the patient portal called "MyChart".  Sign up information is provided on this After Visit Summary.  MyChart is used to connect with patients for Virtual Visits (Telemedicine).  Patients are able to view lab/test results, encounter notes, upcoming appointments, etc.  Non-urgent messages can be sent to your provider as well.   To learn more about what you can do with MyChart, go to [email or web address].    Your next appointment:   9 month(s)  The format for your next appointment:   In Person  Provider:   Berniece Salines, DO     Adopting a Healthy Lifestyle.  Know what a healthy weight is for you (roughly BMI <25) and aim to maintain this   Aim for 7+ servings of fruits and vegetables daily   65-80+ fluid ounces of water or unsweet tea for healthy kidneys   Limit to max 1 drink of alcohol per day; avoid smoking/tobacco   Limit animal fats in diet for cholesterol and heart health - choose grass fed whenever available   Avoid highly processed foods, and foods high in saturated/trans fats   Aim for low stress - take time to unwind and care for your mental health   Aim for 150 min of moderate intensity exercise weekly for heart health, and weights twice weekly for bone health   Aim for 7-9 hours of sleep daily   When it comes to diets, agreement about the perfect plan isnt easy to find, even among the experts. Experts at the [employer] developed an idea known as the Healthy Eating Plate. Just imagine a plate divided into logical, healthy portions.   The emphasis is on diet quality:   Load  up on vegetables and fruits - one-half of your plate: Aim for color and variety, and remember that potatoes dont count.   Go for  whole grains - one-quarter of your plate: Whole wheat, barley, wheat berries, quinoa, oats, brown rice, and foods made with them. If you want pasta, go with whole wheat pasta.   Protein power - one-quarter of your plate: Fish, chicken, beans, and nuts are all healthy, versatile protein sources. Limit red meat.   The diet, however, does go beyond the plate, offering a few other suggestions.   Use healthy plant oils, such as olive, canola, soy, corn, sunflower and peanut. Check the labels, and avoid partially hydrogenated oil, which have unhealthy trans fats.   If youre thirsty, drink water. Coffee and tea are good in moderation, but skip sugary drinks and limit milk and dairy products to one or two daily servings.   The type of carbohydrate in the diet is more important than the amount. Some sources of carbohydrates, such as vegetables, fruits, whole grains, and beans-are healthier than others.   Finally, stay active  Signed, Berniece Salines, DO  05/27/2022 10:41 AM    [place]

## 2022-05-27 NOTE — Patient Instructions (Addendum)
Medication Instructions:  Your physician recommends that you continue on your current medications as directed. Please refer to the Current Medication list given to you today.  *If you need a refill on your cardiac medications before your next appointment, please call your pharmacy*   Lab Work: NONE If you have labs (blood work) drawn today and your tests are completely normal, you will receive your results only by: [place] (if you have MyChart) OR A paper copy in the mail If you have any lab test that is abnormal or we need to change your treatment, we will call you to review the results.   Testing/Procedures: NONE   Follow-Up: At [facility], you and your health needs are our priority.  As part of our continuing mission to provide you with exceptional heart care, we have created designated Provider Care Teams.  These Care Teams include your primary Cardiologist (physician) and Advanced Practice Providers (APPs -  Physician Assistants and Nurse Practitioners) who all work together to provide you with the care you need, when you need it.  We recommend signing up for the patient portal called "MyChart".  Sign up information is provided on this After Visit Summary.  MyChart is used to connect with patients for Virtual Visits (Telemedicine).  Patients are able to view lab/test results, encounter notes, upcoming appointments, etc.  Non-urgent messages can be sent to your provider as well.   To learn more about what you can do with MyChart, go to [email or web address].    Your next appointment:   9 month(s)  The format for your next appointment:   In Person  Provider:   Berniece Salines, DO

## 2022-06-13 ENCOUNTER — Telehealth: Payer: Self-pay

## 2022-06-13 NOTE — Patient Outreach (Signed)
  Care Coordination   Initial Visit Note   06/13/2022 Name: Samantha Anderson MRN: [id number] DOB: [birth year]  Samantha Anderson is a 71 y.o. year old female who sees Mateo Flow, MD for primary care. I engaged with Burke Keels in the providers office today.  What matters to the patients health and wellness today?  Placed call to patient to review and offer [facility] care coordination program. Patient reports she is doing well and denies any needs.     SDOH assessments and interventions completed:  No     Care Coordination Interventions:  No, not indicated   Follow up plan: No further intervention required.   Encounter Outcome:  Pt. Refused   Tomasa Rand, RN, BSN, CEN [facility] [employer] [phone]

## 2022-07-16 DIAGNOSIS — H2513 Age-related nuclear cataract, bilateral: Secondary | ICD-10-CM | POA: Diagnosis not present

## 2022-07-16 DIAGNOSIS — H2511 Age-related nuclear cataract, right eye: Secondary | ICD-10-CM | POA: Diagnosis not present

## 2022-07-16 DIAGNOSIS — H25043 Posterior subcapsular polar age-related cataract, bilateral: Secondary | ICD-10-CM | POA: Diagnosis not present

## 2022-07-16 DIAGNOSIS — H18413 Arcus senilis, bilateral: Secondary | ICD-10-CM | POA: Diagnosis not present

## 2022-07-16 DIAGNOSIS — H25013 Cortical age-related cataract, bilateral: Secondary | ICD-10-CM | POA: Diagnosis not present

## 2022-08-04 DIAGNOSIS — G4733 Obstructive sleep apnea (adult) (pediatric): Secondary | ICD-10-CM | POA: Diagnosis not present

## 2022-08-07 DIAGNOSIS — L820 Inflamed seborrheic keratosis: Secondary | ICD-10-CM | POA: Diagnosis not present

## 2022-08-13 DIAGNOSIS — I10 Essential (primary) hypertension: Secondary | ICD-10-CM | POA: Diagnosis not present

## 2022-08-13 DIAGNOSIS — M8580 Other specified disorders of bone density and structure, unspecified site: Secondary | ICD-10-CM | POA: Diagnosis not present

## 2022-08-13 DIAGNOSIS — E7800 Pure hypercholesterolemia, unspecified: Secondary | ICD-10-CM | POA: Diagnosis not present

## 2022-08-13 DIAGNOSIS — E559 Vitamin D deficiency, unspecified: Secondary | ICD-10-CM | POA: Diagnosis not present

## 2022-08-13 DIAGNOSIS — E039 Hypothyroidism, unspecified: Secondary | ICD-10-CM | POA: Diagnosis not present

## 2022-08-13 DIAGNOSIS — Z0000 Encounter for general adult medical examination without abnormal findings: Secondary | ICD-10-CM | POA: Diagnosis not present

## 2022-08-13 DIAGNOSIS — Z79899 Other long term (current) drug therapy: Secondary | ICD-10-CM | POA: Diagnosis not present

## 2022-08-13 DIAGNOSIS — Z23 Encounter for immunization: Secondary | ICD-10-CM | POA: Diagnosis not present

## 2022-08-27 DIAGNOSIS — H109 Unspecified conjunctivitis: Secondary | ICD-10-CM | POA: Diagnosis not present

## 2022-09-23 DIAGNOSIS — I10 Essential (primary) hypertension: Secondary | ICD-10-CM | POA: Diagnosis not present

## 2022-09-23 DIAGNOSIS — Z6827 Body mass index (BMI) 27.0-27.9, adult: Secondary | ICD-10-CM | POA: Diagnosis not present

## 2022-09-23 DIAGNOSIS — Z1211 Encounter for screening for malignant neoplasm of colon: Secondary | ICD-10-CM | POA: Diagnosis not present

## 2022-09-23 DIAGNOSIS — Z8481 Family history of carrier of genetic disease: Secondary | ICD-10-CM | POA: Diagnosis not present

## 2022-09-23 DIAGNOSIS — Z01419 Encounter for gynecological examination (general) (routine) without abnormal findings: Secondary | ICD-10-CM | POA: Diagnosis not present

## 2022-09-23 DIAGNOSIS — M810 Age-related osteoporosis without current pathological fracture: Secondary | ICD-10-CM | POA: Diagnosis not present

## 2022-09-23 DIAGNOSIS — Z1231 Encounter for screening mammogram for malignant neoplasm of breast: Secondary | ICD-10-CM | POA: Diagnosis not present

## 2022-09-23 DIAGNOSIS — E559 Vitamin D deficiency, unspecified: Secondary | ICD-10-CM | POA: Diagnosis not present

## 2022-10-03 DIAGNOSIS — H04123 Dry eye syndrome of bilateral lacrimal glands: Secondary | ICD-10-CM | POA: Diagnosis not present

## 2022-10-03 DIAGNOSIS — H01009 Unspecified blepharitis unspecified eye, unspecified eyelid: Secondary | ICD-10-CM | POA: Diagnosis not present

## 2022-10-03 DIAGNOSIS — H43811 Vitreous degeneration, right eye: Secondary | ICD-10-CM | POA: Diagnosis not present

## 2022-10-03 DIAGNOSIS — H2511 Age-related nuclear cataract, right eye: Secondary | ICD-10-CM | POA: Diagnosis not present

## 2022-10-04 DIAGNOSIS — H2512 Age-related nuclear cataract, left eye: Secondary | ICD-10-CM | POA: Diagnosis not present

## 2022-10-04 DIAGNOSIS — H2511 Age-related nuclear cataract, right eye: Secondary | ICD-10-CM | POA: Diagnosis not present

## 2022-10-05 DIAGNOSIS — H53141 Visual discomfort, right eye: Secondary | ICD-10-CM | POA: Diagnosis not present

## 2022-10-05 DIAGNOSIS — H35363 Drusen (degenerative) of macula, bilateral: Secondary | ICD-10-CM | POA: Diagnosis not present

## 2022-10-05 DIAGNOSIS — H5319 Other subjective visual disturbances: Secondary | ICD-10-CM | POA: Diagnosis not present

## 2022-10-05 DIAGNOSIS — Z961 Presence of intraocular lens: Secondary | ICD-10-CM | POA: Diagnosis not present

## 2022-10-05 DIAGNOSIS — H43391 Other vitreous opacities, right eye: Secondary | ICD-10-CM | POA: Diagnosis not present

## 2022-10-05 DIAGNOSIS — H43811 Vitreous degeneration, right eye: Secondary | ICD-10-CM | POA: Diagnosis not present

## 2022-10-18 DIAGNOSIS — H53141 Visual discomfort, right eye: Secondary | ICD-10-CM | POA: Diagnosis not present

## 2022-10-18 DIAGNOSIS — H5319 Other subjective visual disturbances: Secondary | ICD-10-CM | POA: Diagnosis not present

## 2022-10-18 DIAGNOSIS — H43391 Other vitreous opacities, right eye: Secondary | ICD-10-CM | POA: Diagnosis not present

## 2022-10-18 DIAGNOSIS — H43811 Vitreous degeneration, right eye: Secondary | ICD-10-CM | POA: Diagnosis not present

## 2022-10-25 DIAGNOSIS — H2512 Age-related nuclear cataract, left eye: Secondary | ICD-10-CM | POA: Diagnosis not present

## 2022-11-03 DIAGNOSIS — G4733 Obstructive sleep apnea (adult) (pediatric): Secondary | ICD-10-CM | POA: Diagnosis not present

## 2022-11-13 DIAGNOSIS — E559 Vitamin D deficiency, unspecified: Secondary | ICD-10-CM | POA: Diagnosis not present

## 2022-12-23 DIAGNOSIS — H43811 Vitreous degeneration, right eye: Secondary | ICD-10-CM | POA: Diagnosis not present

## 2022-12-23 DIAGNOSIS — H35371 Puckering of macula, right eye: Secondary | ICD-10-CM | POA: Diagnosis not present

## 2022-12-23 DIAGNOSIS — H43391 Other vitreous opacities, right eye: Secondary | ICD-10-CM | POA: Diagnosis not present

## 2022-12-23 DIAGNOSIS — H35363 Drusen (degenerative) of macula, bilateral: Secondary | ICD-10-CM | POA: Diagnosis not present

## 2022-12-23 DIAGNOSIS — Z961 Presence of intraocular lens: Secondary | ICD-10-CM | POA: Diagnosis not present

## 2023-01-22 DIAGNOSIS — L820 Inflamed seborrheic keratosis: Secondary | ICD-10-CM | POA: Diagnosis not present

## 2023-02-03 ENCOUNTER — Other Ambulatory Visit: Payer: Self-pay | Admitting: Cardiology

## 2023-02-03 DIAGNOSIS — G4733 Obstructive sleep apnea (adult) (pediatric): Secondary | ICD-10-CM | POA: Diagnosis not present

## 2023-03-24 ENCOUNTER — Other Ambulatory Visit: Payer: Self-pay | Admitting: Cardiology

## 2023-04-21 ENCOUNTER — Encounter: Payer: Self-pay | Admitting: Cardiology

## 2023-04-21 ENCOUNTER — Ambulatory Visit: Payer: Medicare PPO | Attending: Cardiology | Admitting: Cardiology

## 2023-04-21 VITALS — BP 178/100 | HR 60 | Ht 61.0 in | Wt 152.4 lb

## 2023-04-21 DIAGNOSIS — I10 Essential (primary) hypertension: Secondary | ICD-10-CM | POA: Diagnosis not present

## 2023-04-21 DIAGNOSIS — D039 Melanoma in situ, unspecified: Secondary | ICD-10-CM | POA: Diagnosis not present

## 2023-04-21 DIAGNOSIS — E782 Mixed hyperlipidemia: Secondary | ICD-10-CM | POA: Diagnosis not present

## 2023-04-21 DIAGNOSIS — G4733 Obstructive sleep apnea (adult) (pediatric): Secondary | ICD-10-CM | POA: Diagnosis not present

## 2023-04-21 NOTE — Patient Instructions (Signed)
Medication Instructions:  Continue current medications *If you need a refill on your cardiac medications before your next appointment, please call your pharmacy*   Lab Work: none  Testing/Procedures: none   Follow-Up: At [facility], you and your health needs are our priority.  As part of our continuing mission to provide you with exceptional heart care, we have created designated Provider Care Teams.  These Care Teams include your primary Cardiologist (physician) and Advanced Practice Providers (APPs -  Physician Assistants and Nurse Practitioners) who all work together to provide you with the care you need, when you need it.   Your next appointment:   4 week(s) virtual   Provider:   Thomasene Ripple, DO     Other Instructions Please take your blood pressure daily for 2 weeks and send in a MyChart message. Please include heart rates.   HOW TO TAKE YOUR BLOOD PRESSURE: Rest 5 minutes before taking your blood pressure. Don't smoke or drink caffeinated beverages for at least 30 minutes before. Take your blood pressure before (not after) you eat. Sit comfortably with your back supported and both feet on the floor (don't cross your legs). Elevate your arm to heart level on a table or a desk. Use the proper sized cuff. It should fit smoothly and snugly around your bare upper arm. There should be enough room to slip a fingertip under the cuff. The bottom edge of the cuff should be 1 inch above the crease of the elbow. Ideally, take 3 measurements at one sitting and record the average.

## 2023-04-21 NOTE — Progress Notes (Signed)
Cardiology Office Note:    Date:  04/21/2023   ID:  Samantha Anderson, DOB April 12, 1951, MRN [id number]  PCP:  Lise Auer, MD  Cardiologist:  Thomasene Ripple, DO  Electrophysiologist:  None   Referring MD: Lise Auer, MD   " I am doing doing well"   History of Present Illness:    Samantha CHHAY is a 72 y.o. female with a hx of  hypertension, hyperlipidemia, hypothyroidism, severe obstructive sleep apnea now on CPAP. Here today for follow-up visit.  She has been monitoring her blood pressure at home, and the readings have been within normal limits. However, she reports that her blood pressure has been fluctuating, with some readings being high and others being low. She has been adhering to her medication regimen, which includes tamisartan, metoprolol, and amlodipine, to manage her hypertension. She also takes Synthroid in the morning for an unspecified condition. Despite these fluctuations, the patient denies experiencing any symptoms such as lightheadedness or dizziness. She maintains an active lifestyle, walking about three miles a day.   Past Medical History:  Diagnosis Date   Benign essential hypertension    Cancer (HCC) 2006   melanoma   Chest pain    Elevated LDL cholesterol level    Hypothyroidism    Thyroid disease    hyperthyroidism   Vitamin D deficiency     Past Surgical History:  Procedure Laterality Date   CESAREAN SECTION      Current Medications: Current Meds  Medication Sig   amLODipine (NORVASC) 5 MG tablet Take 1 tablet (5 mg total) by mouth in the morning.   Ascorbic Acid (VITAMIN C) 500 MG CAPS Take 500 mg by mouth daily.   Cholecalciferol (VITAMIN D3) 125 MCG (5000 UT) CAPS Take 10,000 Units by mouth daily.   MAGNESIUM GLYCINATE PO Take 120 mg by mouth daily.   metoprolol succinate (TOPROL-XL) 25 MG 24 hr tablet Take 1 tablet (25 mg total) by mouth daily.   SYNTHROID 100 MCG tablet 100 mcg daily.   telmisartan (MICARDIS) 40 MG tablet Take 1 tablet (40  mg total) by mouth daily.   vitamin k 100 MCG tablet Take 100 mcg by mouth daily.   Zinc Sulfate (ZINC 15 PO) Take 15 mg by mouth daily.     Allergies:   Patient has no known allergies.   Social History   Socioeconomic History   Marital status: Married    Spouse name: Not on file   Number of children: Not on file   Years of education: Not on file   Highest education level: Not on file  Occupational History   Not on file  Tobacco Use   Smoking status: Never   Smokeless tobacco: Never  Substance and Sexual Activity   Alcohol use: Yes    Comment: occassionally   Drug use: No   Sexual activity: Yes    Birth control/protection: Post-menopausal  Other Topics Concern   Not on file  Social History Narrative   Not on file   Social Determinants of Health   Financial Resource Strain: Not on file  Food Insecurity: Not on file  Transportation Needs: Not on file  Physical Activity: Not on file  Stress: Not on file  Social Connections: Not on file     Family History: The patient's family history includes Aneurysm in her father; Cancer in her father, maternal uncle, and paternal uncle; Hypercholesterolemia in her brother and sister; Hypertension in her brother, father, and sister; Osteoporosis in her  mother.  ROS:   Review of Systems  Constitution: Negative for decreased appetite, fever and weight gain.  HENT: Negative for congestion, ear discharge, hoarse voice and sore throat.   Eyes: Negative for discharge, redness, vision loss in right eye and visual halos.  Cardiovascular: Negative for chest pain, dyspnea on exertion, leg swelling, orthopnea and palpitations.  Respiratory: Negative for cough, hemoptysis, shortness of breath and snoring.   Endocrine: Negative for heat intolerance and polyphagia.  Hematologic/Lymphatic: Negative for bleeding problem. Does not bruise/bleed easily.  Skin: Negative for flushing, nail changes, rash and suspicious lesions.  Musculoskeletal:  Negative for arthritis, joint pain, muscle cramps, myalgias, neck pain and stiffness.  Gastrointestinal: Negative for abdominal pain, bowel incontinence, diarrhea and excessive appetite.  Genitourinary: Negative for decreased libido, genital sores and incomplete emptying.  Neurological: Negative for brief paralysis, focal weakness, headaches and loss of balance.  Psychiatric/Behavioral: Negative for altered mental status, depression and suicidal ideas.  Allergic/Immunologic: Negative for HIV exposure and persistent infections.    EKGs/Labs/Other Studies Reviewed:    The following studies were reviewed today:   EKG:  The ekg ordered today demonstrates sinus rhythm, HR 60 bpm    TTE 09/29/2021 IMPRESSIONS   1. Left ventricular ejection fraction, by estimation, is 60 to 65%. The  left ventricle has normal function. The left ventricle has no regional  wall motion abnormalities. There is mild concentric left ventricular  hypertrophy. Left ventricular diastolic  parameters are consistent with Grade I diastolic dysfunction (impaired  relaxation).   2. Right ventricular systolic function is normal. The right ventricular  size is normal. There is normal pulmonary artery systolic pressure.   3. The mitral valve is normal in structure. Mild mitral valve  regurgitation. No evidence of mitral stenosis.   4. The aortic valve is tricuspid. Aortic valve regurgitation is not  visualized. No aortic stenosis is present.   5. The inferior vena cava is normal in size with greater than 50%  respiratory variability, suggesting right atrial pressure of 3 mmHg.   FINDINGS   Left Ventricle: Left ventricular ejection fraction, by estimation, is 60  to 65%. The left ventricle has normal function. The left ventricle has no  regional wall motion abnormalities. The left ventricular internal cavity  size was normal in size. There is   mild concentric left ventricular hypertrophy. Left ventricular diastolic   parameters are consistent with Grade I diastolic dysfunction (impaired  relaxation). Normal left ventricular filling pressure.   Right Ventricle: The right ventricular size is normal. No increase in  right ventricular wall thickness. Right ventricular systolic function is  normal. There is normal pulmonary artery systolic pressure. The tricuspid  regurgitant velocity is 2.54 m/s, and   with an assumed right atrial pressure of 3 mmHg, the estimated right  ventricular systolic pressure is 28.8 mmHg.   Left Atrium: Left atrial size was normal in size.   Right Atrium: Right atrial size was normal in size.   Pericardium: There is no evidence of pericardial effusion.   Mitral Valve: The mitral valve is normal in structure. Normal mobility of  the mitral valve leaflets. Mild mitral valve regurgitation. No evidence of  mitral valve stenosis.   Tricuspid Valve: The tricuspid valve is normal in structure. Tricuspid  valve regurgitation is mild . No evidence of tricuspid stenosis.   Aortic Valve: The aortic valve is tricuspid. Aortic valve regurgitation is  not visualized. No aortic stenosis is present.   Pulmonic Valve: The pulmonic valve was normal in  structure. Pulmonic valve  regurgitation is not visualized. No evidence of pulmonic stenosis.   Aorta: The aortic root and ascending aorta are structurally normal, with  no evidence of dilitation.   Venous: A normal flow pattern is recorded from the right upper pulmonary  vein. The inferior vena cava is normal in size with greater than 50%  respiratory variability, suggesting right atrial pressure of 3 mmHg.   IAS/Shunts: No atrial level shunt detected by color flow Doppler.       CCTA  Aorta: Normal size.  No calcifications.  No dissection.   Aortic Valve:  Trileaflet.  No calcifications.   Coronary Arteries:  Normal coronary origin.  Right dominance.   RCA is a large dominant artery that gives rise to PDA and PLVB. There is no  plaque.   Left main is a large artery that gives rise to LAD and LCX arteries.   LAD is a large vessel that has no plaque.   LCX is a non-dominant artery that gives rise to one large OM1 branch. There is no plaque.   Other findings:   Normal pulmonary vein drainage into the left atrium.   Normal left atrial appendage without a thrombus.   Normal size of the pulmonary artery.   IMPRESSION: 1. Coronary calcium score of 0. This was 0 percentile for age and sex matched control.   2. Normal coronary origin with right dominance.   3. No evidence of CAD.   Thomasene Ripple, DO     Electronically Signed   By: Thomasene Ripple MD   On: 09/30/2019 16:46  Recent Labs: No results found for requested labs within last 365 days.  Recent Lipid Panel No results found for: "CHOL", "TRIG", "HDL", "CHOLHDL", "VLDL", "LDLCALC", "LDLDIRECT"  Physical Exam:    VS:  BP (!) 178/100   Pulse 60   Ht 5\' 1"  (1.549 m)   Wt 152 lb 6.4 oz (69.1 kg)   SpO2 94%   BMI 28.80 kg/m     Wt Readings from Last 3 Encounters:  04/21/23 152 lb 6.4 oz (69.1 kg)  05/27/22 151 lb 12.8 oz (68.9 kg)  02/08/22 149 lb 9.6 oz (67.9 kg)     GEN: Well nourished, well developed in no acute distress HEENT: Normal NECK: No JVD; No carotid bruits LYMPHATICS: No lymphadenopathy CARDIAC: S1S2 noted,RRR, no murmurs, rubs, gallops RESPIRATORY:  Clear to auscultation without rales, wheezing or rhonchi  ABDOMEN: Soft, non-tender, non-distended, +bowel sounds, no guarding. EXTREMITIES: No edema, No cyanosis, no clubbing MUSCULOSKELETAL:  No deformity  SKIN: Warm and dry NEUROLOGIC:  Alert and oriented x 3, non-focal PSYCHIATRIC:  Normal affect, good insight  ASSESSMENT:    1. Essential hypertension   2. OSA on CPAP   3. Mixed hyperlipidemia     PLAN:    Hypertension Blood pressure readings are variable with some high readings in the morning and low readings in the evening. No symptoms of hypotension reported.  Currently on Tamisartan 40mg  half in the morning and half at night, Metoprolol at night, and Amlodipine in the morning. -Continue current medication regimen.  OSA - on cpap  Hyperlipidemia - continue with lifestyle modification    In the meantime she will take her blood pressure daily and send me this information after 2 weeks.  The patient is in agreement with the above plan. The patient left the office in stable condition.  The patient will follow up in 4 weeks virtually.   Medication Adjustments/Labs and Tests Ordered: Current  medicines are reviewed at length with the patient today.  Concerns regarding medicines are outlined above.  Orders Placed This Encounter  Procedures   EKG 12-Lead   No orders of the defined types were placed in this encounter.   Patient Instructions  Medication Instructions:  Continue current medications *If you need a refill on your cardiac medications before your next appointment, please call your pharmacy*   Lab Work: none  Testing/Procedures: none   Follow-Up: At [facility], you and your health needs are our priority.  As part of our continuing mission to provide you with exceptional heart care, we have created designated Provider Care Teams.  These Care Teams include your primary Cardiologist (physician) and Advanced Practice Providers (APPs -  Physician Assistants and Nurse Practitioners) who all work together to provide you with the care you need, when you need it.   Your next appointment:   4 week(s) virtual   Provider:   Thomasene Ripple, DO     Other Instructions Please take your blood pressure daily for 2 weeks and send in a MyChart message. Please include heart rates.   HOW TO TAKE YOUR BLOOD PRESSURE: Rest 5 minutes before taking your blood pressure. Don't smoke or drink caffeinated beverages for at least 30 minutes before. Take your blood pressure before (not after) you eat. Sit comfortably with your back supported and  both feet on the floor (don't cross your legs). Elevate your arm to heart level on a table or a desk. Use the proper sized cuff. It should fit smoothly and snugly around your bare upper arm. There should be enough room to slip a fingertip under the cuff. The bottom edge of the cuff should be 1 inch above the crease of the elbow. Ideally, take 3 measurements at one sitting and record the average.    Adopting a Healthy Lifestyle.  Know what a healthy weight is for you (roughly BMI <25) and aim to maintain this   Aim for 7+ servings of fruits and vegetables daily   65-80+ fluid ounces of water or unsweet tea for healthy kidneys   Limit to max 1 drink of alcohol per day; avoid smoking/tobacco   Limit animal fats in diet for cholesterol and heart health - choose grass fed whenever available   Avoid highly processed foods, and foods high in saturated/trans fats   Aim for low stress - take time to unwind and care for your mental health   Aim for 150 min of moderate intensity exercise weekly for heart health, and weights twice weekly for bone health   Aim for 7-9 hours of sleep daily   When it comes to diets, agreement about the perfect plan isnt easy to find, even among the experts. Experts at the [facility] [employer] developed an idea known as the Healthy Eating Plate. Just imagine a plate divided into logical, healthy portions.   The emphasis is on diet quality:   Load up on vegetables and fruits - one-half of your plate: Aim for color and variety, and remember that potatoes dont count.   Go for whole grains - one-quarter of your plate: Whole wheat, barley, wheat berries, quinoa, oats, brown rice, and foods made with them. If you want pasta, go with whole wheat pasta.   Protein power - one-quarter of your plate: Fish, chicken, beans, and nuts are all healthy, versatile protein sources. Limit red meat.   The diet, however, does go beyond the plate, offering a few other  suggestions.   Use healthy plant oils, such as olive, canola, soy, corn, sunflower and peanut. Check the labels, and avoid partially hydrogenated oil, which have unhealthy trans fats.   If youre thirsty, drink water. Coffee and tea are good in moderation, but skip sugary drinks and limit milk and dairy products to one or two daily servings.   The type of carbohydrate in the diet is more important than the amount. Some sources of carbohydrates, such as vegetables, fruits, whole grains, and beans-are healthier than others.   Finally, stay active  Signed, Thomasene Ripple, DO  04/21/2023 2:04 PM    [facility]

## 2023-05-01 ENCOUNTER — Other Ambulatory Visit: Payer: Self-pay | Admitting: Cardiology

## 2023-05-06 DIAGNOSIS — G4733 Obstructive sleep apnea (adult) (pediatric): Secondary | ICD-10-CM | POA: Diagnosis not present

## 2023-05-08 ENCOUNTER — Encounter: Payer: Self-pay | Admitting: Cardiology

## 2023-05-12 DIAGNOSIS — L820 Inflamed seborrheic keratosis: Secondary | ICD-10-CM | POA: Diagnosis not present

## 2023-05-12 DIAGNOSIS — L918 Other hypertrophic disorders of the skin: Secondary | ICD-10-CM | POA: Diagnosis not present

## 2023-05-12 DIAGNOSIS — D225 Melanocytic nevi of trunk: Secondary | ICD-10-CM | POA: Diagnosis not present

## 2023-05-12 DIAGNOSIS — L821 Other seborrheic keratosis: Secondary | ICD-10-CM | POA: Diagnosis not present

## 2023-05-12 DIAGNOSIS — D2239 Melanocytic nevi of other parts of face: Secondary | ICD-10-CM | POA: Diagnosis not present

## 2023-05-12 DIAGNOSIS — D485 Neoplasm of uncertain behavior of skin: Secondary | ICD-10-CM | POA: Diagnosis not present

## 2023-05-12 MED ORDER — AMLODIPINE BESYLATE 5 MG PO TABS: 5.0000 mg | ORAL_TABLET | Freq: Two times a day (BID) | ORAL | 3 refills | Status: DC

## 2023-05-20 ENCOUNTER — Encounter: Payer: Self-pay | Admitting: Cardiology

## 2023-05-20 ENCOUNTER — Ambulatory Visit: Payer: Medicare PPO | Attending: Cardiology | Admitting: Cardiology

## 2023-05-20 VITALS — BP 125/80 | HR 54 | Ht 61.0 in | Wt 150.0 lb

## 2023-05-20 DIAGNOSIS — E782 Mixed hyperlipidemia: Secondary | ICD-10-CM | POA: Diagnosis not present

## 2023-05-20 DIAGNOSIS — G4733 Obstructive sleep apnea (adult) (pediatric): Secondary | ICD-10-CM

## 2023-05-20 DIAGNOSIS — I10 Essential (primary) hypertension: Secondary | ICD-10-CM | POA: Diagnosis not present

## 2023-05-20 NOTE — Patient Instructions (Signed)
Medication Instructions:  Your physician recommends that you continue on your current medications as directed. Please refer to the Current Medication list given to you today.  *If you need a refill on your cardiac medications before your next appointment, please call your pharmacy*   Lab Work: None   Testing/Procedures: None   Follow-Up: At [facility], you and your health needs are our priority.  As part of our continuing mission to provide you with exceptional heart care, we have created designated Provider Care Teams.  These Care Teams include your primary Cardiologist (physician) and Advanced Practice Providers (APPs -  Physician Assistants and Nurse Practitioners) who all work together to provide you with the care you need, when you need it.   Your next appointment:   9 month(s)  Provider:   Kardie Tobb, DO   

## 2023-05-20 NOTE — Progress Notes (Signed)
Virtual Visit via Video Note   Because of Samantha Anderson's co-morbid illnesses, she is at least at moderate risk for complications without adequate follow up.  This format is felt to be most appropriate for this patient at this time.  All issues noted in this document were discussed and addressed.  A limited physical exam was performed with this format.  Please refer to the patient's chart for her consent to telehealth for [facility].      Date:  05/20/2023   ID:  Samantha Anderson, DOB [birth year], MRN [id number]  Patient Location: Home Provider Location: Office/Clinic  PCP:  Lise Auer, MD  Cardiologist:  Thomasene Ripple, DO  Electrophysiologist:  None   Evaluation Performed:  Follow-Up Visit  Chief Complaint:  " I have seen change in the blood pressure   History of Present Illness:    Samantha Anderson is a 72 y.o. female with a hx of hypertension, hyperlipidemia, hypothyroidism, severe obstructive sleep apnea now on CPAP. Here today for follow-up visit.    At her last visit she was hypertensive despite the telmisartan and use of low-dose metoprolol started on a medicine.  I started her on amlodipine 5 mg to help her blood pressure.  She tells me since this medication was started blood pressure has improved significantly.  No other complaints at this time.  The patient does not have symptoms concerning for COVID-19 infection (fever, chills, cough, or new shortness of breath).    Past Medical History:  Diagnosis Date   Benign essential hypertension    Cancer (HCC) 2006   melanoma   Chest pain    Elevated LDL cholesterol level    Hypothyroidism    Thyroid disease    hyperthyroidism   Vitamin D deficiency    Past Surgical History:  Procedure Laterality Date   CESAREAN SECTION       Current Meds  Medication Sig   amLODipine (NORVASC) 5 MG tablet Take 1 tablet (5 mg total) by mouth in the morning and at bedtime.   Ascorbic Acid (VITAMIN C) 500 MG CAPS Take  500 mg by mouth daily.   aspirin EC 81 MG tablet Take 81 mg by mouth daily.   Cholecalciferol (VITAMIN D3) 125 MCG (5000 UT) CAPS Take 10,000 Units by mouth daily.   MAGNESIUM GLYCINATE PO Take 120 mg by mouth daily.   metoprolol succinate (TOPROL-XL) 25 MG 24 hr tablet Take 1 tablet (25 mg total) by mouth daily.   SYNTHROID 100 MCG tablet 100 mcg daily.   telmisartan (MICARDIS) 40 MG tablet Take 1 tablet (40 mg total) by mouth daily.   vitamin k 100 MCG tablet Take 100 mcg by mouth daily.   Zinc Sulfate (ZINC 15 PO) Take 15 mg by mouth daily.     Allergies:   Patient has no known allergies.   Social History   Tobacco Use   Smoking status: Never   Smokeless tobacco: Never  Substance Use Topics   Alcohol use: Yes    Comment: occassionally   Drug use: No     Family Hx: The patient's family history includes Aneurysm in her father; Cancer in her father, maternal uncle, and paternal uncle; Hypercholesterolemia in her brother and sister; Hypertension in her brother, father, and sister; Osteoporosis in her mother.  ROS:   Please see the history of present illness.     All other systems reviewed and are negative.   Prior CV studies:   The following studies  were reviewed today:  Reviewed echo and CCTA  Labs/Other Tests and Data Reviewed:    EKG:  No ECG reviewed.  Recent Labs: No results found for requested labs within last 365 days.   Recent Lipid Panel No results found for: "CHOL", "TRIG", "HDL", "CHOLHDL", "LDLCALC", "LDLDIRECT"  Wt Readings from Last 3 Encounters:  05/20/23 150 lb (68 kg)  04/21/23 152 lb 6.4 oz (69.1 kg)  05/27/22 151 lb 12.8 oz (68.9 kg)     Objective:    Vital Signs:  BP 125/80   Pulse (!) 54   Ht 5\' 1"  (1.549 m)   Wt 150 lb (68 kg)   BMI 28.34 kg/m      ASSESSMENT & PLAN:    Chronic hypertension -she shared her home blood pressure with me.  This is at target.  Therefore we will keep the patient on the telmisartan 40 mg daily,  amlodipine 5 mg daily her metoprolol succinate has been more contributing improvement for her PSVT. OSA continue CPAP Hyperlipidemia - continue with current statin medication.  COVID-19 Education: The signs and symptoms of COVID-19 were discussed with the patient and how to seek care for testing (follow up with PCP or arrange E-visit).  The importance of social distancing was discussed today.  Time:   Today, I have spent 12 minutes with the patient with telehealth technology discussing the above problems.     Medication Adjustments/Labs and Tests Ordered: Current medicines are reviewed at length with the patient today.  Concerns regarding medicines are outlined above.   Tests Ordered: No orders of the defined types were placed in this encounter.   Medication Changes: No orders of the defined types were placed in this encounter.   Follow Up:  In Person in 9 month(s)  Signed, Thomasene Ripple, DO  05/20/2023 8:53 AM    [facility]

## 2023-07-09 DIAGNOSIS — L821 Other seborrheic keratosis: Secondary | ICD-10-CM | POA: Diagnosis not present

## 2023-07-09 DIAGNOSIS — D485 Neoplasm of uncertain behavior of skin: Secondary | ICD-10-CM | POA: Diagnosis not present

## 2023-08-15 DIAGNOSIS — E559 Vitamin D deficiency, unspecified: Secondary | ICD-10-CM | POA: Diagnosis not present

## 2023-08-15 DIAGNOSIS — G4733 Obstructive sleep apnea (adult) (pediatric): Secondary | ICD-10-CM | POA: Diagnosis not present

## 2023-08-15 DIAGNOSIS — Z9181 History of falling: Secondary | ICD-10-CM | POA: Diagnosis not present

## 2023-08-15 DIAGNOSIS — Z79899 Other long term (current) drug therapy: Secondary | ICD-10-CM | POA: Diagnosis not present

## 2023-08-15 DIAGNOSIS — Z1339 Encounter for screening examination for other mental health and behavioral disorders: Secondary | ICD-10-CM | POA: Diagnosis not present

## 2023-08-15 DIAGNOSIS — E039 Hypothyroidism, unspecified: Secondary | ICD-10-CM | POA: Diagnosis not present

## 2023-08-15 DIAGNOSIS — Z23 Encounter for immunization: Secondary | ICD-10-CM | POA: Diagnosis not present

## 2023-08-15 DIAGNOSIS — Z0000 Encounter for general adult medical examination without abnormal findings: Secondary | ICD-10-CM | POA: Diagnosis not present

## 2023-08-15 DIAGNOSIS — E7800 Pure hypercholesterolemia, unspecified: Secondary | ICD-10-CM | POA: Diagnosis not present

## 2023-08-15 LAB — LAB REPORT - SCANNED
Free T4: 1.42 ng/dL
TSH: 0.95

## 2023-09-06 DIAGNOSIS — G4733 Obstructive sleep apnea (adult) (pediatric): Secondary | ICD-10-CM | POA: Diagnosis not present

## 2023-09-30 DIAGNOSIS — M810 Age-related osteoporosis without current pathological fracture: Secondary | ICD-10-CM | POA: Diagnosis not present

## 2023-09-30 DIAGNOSIS — Z1231 Encounter for screening mammogram for malignant neoplasm of breast: Secondary | ICD-10-CM | POA: Diagnosis not present

## 2023-09-30 DIAGNOSIS — M8588 Other specified disorders of bone density and structure, other site: Secondary | ICD-10-CM | POA: Diagnosis not present

## 2023-10-07 DIAGNOSIS — M810 Age-related osteoporosis without current pathological fracture: Secondary | ICD-10-CM | POA: Diagnosis not present

## 2023-10-07 DIAGNOSIS — Z1231 Encounter for screening mammogram for malignant neoplasm of breast: Secondary | ICD-10-CM | POA: Diagnosis not present

## 2023-10-07 DIAGNOSIS — Z1330 Encounter for screening examination for mental health and behavioral disorders, unspecified: Secondary | ICD-10-CM | POA: Diagnosis not present

## 2023-10-07 DIAGNOSIS — Z6827 Body mass index (BMI) 27.0-27.9, adult: Secondary | ICD-10-CM | POA: Diagnosis not present

## 2023-10-07 DIAGNOSIS — Z01419 Encounter for gynecological examination (general) (routine) without abnormal findings: Secondary | ICD-10-CM | POA: Diagnosis not present

## 2023-10-07 DIAGNOSIS — Z8481 Family history of carrier of genetic disease: Secondary | ICD-10-CM | POA: Diagnosis not present

## 2023-10-07 DIAGNOSIS — E559 Vitamin D deficiency, unspecified: Secondary | ICD-10-CM | POA: Diagnosis not present

## 2023-10-07 DIAGNOSIS — Z1211 Encounter for screening for malignant neoplasm of colon: Secondary | ICD-10-CM | POA: Diagnosis not present

## 2023-11-06 DIAGNOSIS — H9201 Otalgia, right ear: Secondary | ICD-10-CM | POA: Diagnosis not present

## 2023-11-17 DIAGNOSIS — H43811 Vitreous degeneration, right eye: Secondary | ICD-10-CM | POA: Diagnosis not present

## 2023-11-17 DIAGNOSIS — H01009 Unspecified blepharitis unspecified eye, unspecified eyelid: Secondary | ICD-10-CM | POA: Diagnosis not present

## 2023-11-17 DIAGNOSIS — H04123 Dry eye syndrome of bilateral lacrimal glands: Secondary | ICD-10-CM | POA: Diagnosis not present

## 2023-11-17 DIAGNOSIS — H26493 Other secondary cataract, bilateral: Secondary | ICD-10-CM | POA: Diagnosis not present

## 2023-11-17 DIAGNOSIS — H524 Presbyopia: Secondary | ICD-10-CM | POA: Diagnosis not present

## 2023-11-17 DIAGNOSIS — Z961 Presence of intraocular lens: Secondary | ICD-10-CM | POA: Diagnosis not present

## 2023-12-03 DIAGNOSIS — S62640A Nondisplaced fracture of proximal phalanx of right index finger, initial encounter for closed fracture: Secondary | ICD-10-CM | POA: Diagnosis not present

## 2023-12-03 DIAGNOSIS — M79644 Pain in right finger(s): Secondary | ICD-10-CM | POA: Diagnosis not present

## 2023-12-07 DIAGNOSIS — G4733 Obstructive sleep apnea (adult) (pediatric): Secondary | ICD-10-CM | POA: Diagnosis not present

## 2023-12-08 DIAGNOSIS — S62511A Displaced fracture of proximal phalanx of right thumb, initial encounter for closed fracture: Secondary | ICD-10-CM | POA: Diagnosis not present

## 2024-01-19 DIAGNOSIS — S62511A Displaced fracture of proximal phalanx of right thumb, initial encounter for closed fracture: Secondary | ICD-10-CM | POA: Diagnosis not present

## 2024-01-29 DIAGNOSIS — M79644 Pain in right finger(s): Secondary | ICD-10-CM | POA: Diagnosis not present

## 2024-02-02 DIAGNOSIS — M79644 Pain in right finger(s): Secondary | ICD-10-CM | POA: Diagnosis not present

## 2024-02-12 DIAGNOSIS — M79644 Pain in right finger(s): Secondary | ICD-10-CM | POA: Diagnosis not present

## 2024-02-18 DIAGNOSIS — M79644 Pain in right finger(s): Secondary | ICD-10-CM | POA: Diagnosis not present

## 2024-02-19 DIAGNOSIS — D2239 Melanocytic nevi of other parts of face: Secondary | ICD-10-CM | POA: Diagnosis not present

## 2024-02-19 DIAGNOSIS — D2271 Melanocytic nevi of right lower limb, including hip: Secondary | ICD-10-CM | POA: Diagnosis not present

## 2024-02-19 DIAGNOSIS — D2262 Melanocytic nevi of left upper limb, including shoulder: Secondary | ICD-10-CM | POA: Diagnosis not present

## 2024-02-19 DIAGNOSIS — L570 Actinic keratosis: Secondary | ICD-10-CM | POA: Diagnosis not present

## 2024-02-19 DIAGNOSIS — L218 Other seborrheic dermatitis: Secondary | ICD-10-CM | POA: Diagnosis not present

## 2024-02-19 DIAGNOSIS — D2371 Other benign neoplasm of skin of right lower limb, including hip: Secondary | ICD-10-CM | POA: Diagnosis not present

## 2024-02-19 DIAGNOSIS — L84 Corns and callosities: Secondary | ICD-10-CM | POA: Diagnosis not present

## 2024-02-19 DIAGNOSIS — D2261 Melanocytic nevi of right upper limb, including shoulder: Secondary | ICD-10-CM | POA: Diagnosis not present

## 2024-02-19 DIAGNOSIS — L821 Other seborrheic keratosis: Secondary | ICD-10-CM | POA: Diagnosis not present

## 2024-02-23 DIAGNOSIS — M79644 Pain in right finger(s): Secondary | ICD-10-CM | POA: Diagnosis not present

## 2024-02-27 ENCOUNTER — Ambulatory Visit: Attending: Cardiology | Admitting: Cardiology

## 2024-02-27 ENCOUNTER — Encounter: Payer: Self-pay | Admitting: Cardiology

## 2024-02-27 VITALS — BP 156/80 | HR 56 | Ht 61.0 in | Wt 154.4 lb

## 2024-02-27 DIAGNOSIS — I10 Essential (primary) hypertension: Secondary | ICD-10-CM | POA: Diagnosis not present

## 2024-02-27 NOTE — Patient Instructions (Signed)

## 2024-02-27 NOTE — Progress Notes (Signed)
 Cardiology Office Note:    Date:  03/01/2024   ID:  Clotine, Heiner 08/28/1950, MRN [id number]  PCP:  Fernand Tracey LABOR, MD  Cardiologist:  Arian Mcquitty, DO  Electrophysiologist:  None   Referring MD: Fernand Tracey LABOR, MD    I am, doing well  History of Present Illness:    Samantha Anderson is a 73 y.o. female with a hx of hypertension, hyperlipidemia, hypothyroidism, severe obstructive sleep apnea now on CPAP. Here today for follow-up visit.   Since her last visit with me she has not had any hospitalizations.  She does report 2 episodes of leg swelling during which time she was at the beach and 1 at a concert.  This has resolved. No other complaints at this time.  Past Medical History:  Diagnosis Date   Benign essential hypertension    Cancer (HCC) 2006   melanoma   Chest pain    Elevated LDL cholesterol level    Hypothyroidism    Thyroid  disease    hyperthyroidism   Vitamin D deficiency     Past Surgical History:  Procedure Laterality Date   CESAREAN SECTION      Current Medications: Current Meds  Medication Sig   amLODipine  (NORVASC ) 5 MG tablet Take 1 tablet (5 mg total) by mouth in the morning and at bedtime.   Ascorbic Acid (VITAMIN C) 500 MG CAPS Take 500 mg by mouth daily.   aspirin EC 81 MG tablet Take 81 mg by mouth daily.   Cholecalciferol (VITAMIN D3) 125 MCG (5000 UT) CAPS Take 10,000 Units by mouth daily.   MAGNESIUM GLYCINATE PO Take 120 mg by mouth daily.   metoprolol  succinate (TOPROL -XL) 25 MG 24 hr tablet Take 1 tablet (25 mg total) by mouth daily.   SYNTHROID 100 MCG tablet 100 mcg daily.   telmisartan  (MICARDIS ) 40 MG tablet Take 1 tablet (40 mg total) by mouth daily.   vitamin k 100 MCG tablet Take 100 mcg by mouth daily.   Zinc Sulfate (ZINC 15 PO) Take 15 mg by mouth daily.     Allergies:   Patient has no known allergies.   Social History   Socioeconomic History   Marital status: Married    Spouse name: Not on file   Number of children:  Not on file   Years of education: Not on file   Highest education level: Not on file  Occupational History   Not on file  Tobacco Use   Smoking status: Never   Smokeless tobacco: Never  Substance and Sexual Activity   Alcohol use: Yes    Comment: occassionally   Drug use: No   Sexual activity: Yes    Birth control/protection: Post-menopausal  Other Topics Concern   Not on file  Social History Narrative   Not on file   Social Drivers of Health   Financial Resource Strain: Not on file  Food Insecurity: Not on file  Transportation Needs: Not on file  Physical Activity: Not on file  Stress: Not on file  Social Connections: Not on file     Family History: The patient's family history includes Aneurysm in her father; Cancer in her father, maternal uncle, and paternal uncle; Hypercholesterolemia in her brother and sister; Hypertension in her brother, father, and sister; Osteoporosis in her mother.  ROS:   Review of Systems  Constitution: Negative for decreased appetite, fever and weight gain.  HENT: Negative for congestion, ear discharge, hoarse voice and sore throat.   Eyes:  Negative for discharge, redness, vision loss in right eye and visual halos.  Cardiovascular: Negative for chest pain, dyspnea on exertion, leg swelling, orthopnea and palpitations.  Respiratory: Negative for cough, hemoptysis, shortness of breath and snoring.   Endocrine: Negative for heat intolerance and polyphagia.  Hematologic/Lymphatic: Negative for bleeding problem. Does not bruise/bleed easily.  Skin: Negative for flushing, nail changes, rash and suspicious lesions.  Musculoskeletal: Negative for arthritis, joint pain, muscle cramps, myalgias, neck pain and stiffness.  Gastrointestinal: Negative for abdominal pain, bowel incontinence, diarrhea and excessive appetite.  Genitourinary: Negative for decreased libido, genital sores and incomplete emptying.  Neurological: Negative for brief paralysis, focal  weakness, headaches and loss of balance.  Psychiatric/Behavioral: Negative for altered mental status, depression and suicidal ideas.  Allergic/Immunologic: Negative for HIV exposure and persistent infections.    EKGs/Labs/Other Studies Reviewed:    The following studies were reviewed today:   EKG:  The ekg ordered today demonstrates   Recent Labs: 08/15/2023: TSH 0.950  Recent Lipid Panel No results found for: CHOL, TRIG, HDL, CHOLHDL, VLDL, LDLCALC, LDLDIRECT  Physical Exam:    VS:  BP (!) 156/80 (BP Location: Left Arm, Patient Position: Sitting, Cuff Size: Normal)   Pulse (!) 56   Ht 5' 1 (1.549 m)   Wt 154 lb 6.4 oz (70 kg)   SpO2 97%   BMI 29.17 kg/m     Wt Readings from Last 3 Encounters:  02/27/24 154 lb 6.4 oz (70 kg)  05/20/23 150 lb (68 kg)  04/21/23 152 lb 6.4 oz (69.1 kg)     GEN: Well nourished, well developed in no acute distress HEENT: Normal NECK: No JVD; No carotid bruits LYMPHATICS: No lymphadenopathy CARDIAC: S1S2 noted,RRR, no murmurs, rubs, gallops RESPIRATORY:  Clear to auscultation without rales, wheezing or rhonchi  ABDOMEN: Soft, non-tender, non-distended, +bowel sounds, no guarding. EXTREMITIES: No edema, No cyanosis, no clubbing MUSCULOSKELETAL:  No deformity  SKIN: Warm and dry NEUROLOGIC:  Alert and oriented x 3, non-focal PSYCHIATRIC:  Normal affect, good insight  ASSESSMENT:    1. Essential hypertension    PLAN:     Primary hypertension-blood pressure improved slightly after sitting 144/82 mmHg.  I do also think that there is a factor of whitecoat hypertension here.  Her blood pressure at home is averaging less than 130/80 mmHg.  No medication changes  The patient is in agreement with the above plan. The patient left the office in stable condition.  The patient will follow up in   Medication Adjustments/Labs and Tests Ordered: Current medicines are reviewed at length with the patient today.  Concerns regarding  medicines are outlined above.  Orders Placed This Encounter  Procedures   EKG 12-Lead   No orders of the defined types were placed in this encounter.   Patient Instructions  Medication Instructions:  Your physician recommends that you continue on your current medications as directed. Please refer to the Current Medication list given to you today.  *If you need a refill on your cardiac medications before your next appointment, please call your pharmacy*   Follow-Up: At [facility], you and your health needs are our priority.  As part of our continuing mission to provide you with exceptional heart care, our providers are all part of one team.  This team includes your primary Cardiologist (physician) and Advanced Practice Providers or APPs (Physician Assistants and Nurse Practitioners) who all work together to provide you with the care you need, when you need it.  Your next  appointment:   1 year(s)  Provider:   Abbiegail Landgren, DO     Adopting a Healthy Lifestyle.  Know what a healthy weight is for you (roughly BMI <25) and aim to maintain this   Aim for 7+ servings of fruits and vegetables daily   65-80+ fluid ounces of water or unsweet tea for healthy kidneys   Limit to max 1 drink of alcohol per day; avoid smoking/tobacco   Limit animal fats in diet for cholesterol and heart health - choose grass fed whenever available   Avoid highly processed foods, and foods high in saturated/trans fats   Aim for low stress - take time to unwind and care for your mental health   Aim for 150 min of moderate intensity exercise weekly for heart health, and weights twice weekly for bone health   Aim for 7-9 hours of sleep daily   When it comes to diets, agreement about the perfect plan isnt easy to find, even among the experts. Experts at the [facility] [employer] developed an idea known as the Healthy Eating Plate. Just imagine a plate divided into logical, healthy  portions.   The emphasis is on diet quality:   Load up on vegetables and fruits - one-half of your plate: Aim for color and variety, and remember that potatoes dont count.   Go for whole grains - one-quarter of your plate: Whole wheat, barley, wheat berries, quinoa, oats, brown rice, and foods made with them. If you want pasta, go with whole wheat pasta.   Protein power - one-quarter of your plate: Fish, chicken, beans, and nuts are all healthy, versatile protein sources. Limit red meat.   The diet, however, does go beyond the plate, offering a few other suggestions.   Use healthy plant oils, such as olive, canola, soy, corn, sunflower and peanut. Check the labels, and avoid partially hydrogenated oil, which have unhealthy trans fats.   If youre thirsty, drink water. Coffee and tea are good in moderation, but skip sugary drinks and limit milk and dairy products to one or two daily servings.   The type of carbohydrate in the diet is more important than the amount. Some sources of carbohydrates, such as vegetables, fruits, whole grains, and beans-are healthier than others.   Finally, stay active  Signed, Magdaleno Lortie, DO  03/01/2024 4:46 PM    [facility]

## 2024-03-01 DIAGNOSIS — S62511D Displaced fracture of proximal phalanx of right thumb, subsequent encounter for fracture with routine healing: Secondary | ICD-10-CM | POA: Diagnosis not present

## 2024-03-01 DIAGNOSIS — M79644 Pain in right finger(s): Secondary | ICD-10-CM | POA: Diagnosis not present

## 2024-03-09 DIAGNOSIS — G4733 Obstructive sleep apnea (adult) (pediatric): Secondary | ICD-10-CM | POA: Diagnosis not present

## 2024-03-11 DIAGNOSIS — M79644 Pain in right finger(s): Secondary | ICD-10-CM | POA: Diagnosis not present

## 2024-03-11 DIAGNOSIS — S62511D Displaced fracture of proximal phalanx of right thumb, subsequent encounter for fracture with routine healing: Secondary | ICD-10-CM | POA: Diagnosis not present

## 2024-03-18 DIAGNOSIS — M79644 Pain in right finger(s): Secondary | ICD-10-CM | POA: Diagnosis not present

## 2024-03-18 DIAGNOSIS — S62511D Displaced fracture of proximal phalanx of right thumb, subsequent encounter for fracture with routine healing: Secondary | ICD-10-CM | POA: Diagnosis not present

## 2024-03-25 DIAGNOSIS — M79644 Pain in right finger(s): Secondary | ICD-10-CM | POA: Diagnosis not present

## 2024-03-25 DIAGNOSIS — S62511D Displaced fracture of proximal phalanx of right thumb, subsequent encounter for fracture with routine healing: Secondary | ICD-10-CM | POA: Diagnosis not present

## 2024-04-01 DIAGNOSIS — M79644 Pain in right finger(s): Secondary | ICD-10-CM | POA: Diagnosis not present

## 2024-04-01 DIAGNOSIS — S62511D Displaced fracture of proximal phalanx of right thumb, subsequent encounter for fracture with routine healing: Secondary | ICD-10-CM | POA: Diagnosis not present

## 2024-04-05 DIAGNOSIS — S62511A Displaced fracture of proximal phalanx of right thumb, initial encounter for closed fracture: Secondary | ICD-10-CM | POA: Diagnosis not present

## 2024-04-05 DIAGNOSIS — M1812 Unilateral primary osteoarthritis of first carpometacarpal joint, left hand: Secondary | ICD-10-CM | POA: Diagnosis not present

## 2024-04-26 ENCOUNTER — Other Ambulatory Visit: Payer: Self-pay | Admitting: Cardiology

## 2024-05-03 ENCOUNTER — Other Ambulatory Visit: Payer: Self-pay | Admitting: Cardiology

## 2024-05-21 ENCOUNTER — Encounter: Payer: Self-pay | Admitting: Cardiology

## 2024-05-21 MED ORDER — AMLODIPINE BESYLATE 5 MG PO TABS: 5.0000 mg | ORAL_TABLET | Freq: Two times a day (BID) | ORAL | 2 refills | Status: AC

## 2024-05-27 DIAGNOSIS — Z23 Encounter for immunization: Secondary | ICD-10-CM | POA: Diagnosis not present

## 2024-06-05 ENCOUNTER — Encounter: Payer: Self-pay | Admitting: Gastroenterology
